# Patient Record
Sex: Male | Born: 1980 | Race: Black or African American | Hispanic: No | Marital: Single | Smoking: Never smoker
Health system: Southern US, Community
[De-identification: ages and names within clinical notes are randomized; demographics above are authoritative.]

## PROBLEM LIST (undated history)

## (undated) DIAGNOSIS — I10 Essential (primary) hypertension: Secondary | ICD-10-CM

## (undated) DIAGNOSIS — G4730 Sleep apnea, unspecified: Secondary | ICD-10-CM

## (undated) DIAGNOSIS — R7303 Prediabetes: Secondary | ICD-10-CM

## (undated) HISTORY — DX: Prediabetes: R73.03

## (undated) HISTORY — DX: Sleep apnea, unspecified: G47.30

---

## 2018-11-10 ENCOUNTER — Other Ambulatory Visit: Payer: Self-pay

## 2018-11-10 ENCOUNTER — Emergency Department (HOSPITAL_BASED_OUTPATIENT_CLINIC_OR_DEPARTMENT_OTHER)
Admit: 2018-11-10 | Discharge: 2018-11-10 | Disposition: A | Discharge status: Home / Self Care | Payer: Self-pay | Attending: Emergency Medicine | Admitting: Emergency Medicine

## 2018-11-10 ENCOUNTER — Emergency Department (HOSPITAL_COMMUNITY)
Admission: EM | Admit: 2018-11-10 | Discharge: 2018-11-10 | Disposition: A | Discharge status: Home / Self Care | Payer: Self-pay | Attending: Emergency Medicine | Admitting: Emergency Medicine

## 2018-11-10 ENCOUNTER — Encounter (HOSPITAL_COMMUNITY): Payer: Self-pay | Admitting: *Deleted

## 2018-11-10 DIAGNOSIS — M79605 Pain in left leg: Secondary | ICD-10-CM

## 2018-11-10 DIAGNOSIS — M79609 Pain in unspecified limb: Secondary | ICD-10-CM

## 2018-11-10 DIAGNOSIS — R590 Localized enlarged lymph nodes: Secondary | ICD-10-CM | POA: Insufficient documentation

## 2018-11-10 LAB — BASIC METABOLIC PANEL
Anion gap: 11 (ref 5–15)
BUN: 12 mg/dL (ref 6–20)
CO2: 23 mmol/L (ref 22–32)
Calcium: 9.4 mg/dL (ref 8.9–10.3)
Chloride: 104 mmol/L (ref 98–111)
Creatinine, Ser: 1.06 mg/dL (ref 0.61–1.24)
GFR calc Af Amer: >60 mL/min (ref >60)
GFR calc non Af Amer: >60 mL/min (ref >60)
Glucose, Bld: 95 mg/dL (ref 70–99)
Potassium: 3.8 mmol/L (ref 3.5–5.1)
Sodium: 138 mmol/L (ref 135–145)

## 2018-11-10 LAB — CBC
HCT: 42.4 % (ref 39.0–52.0)
Hemoglobin: 14 g/dL (ref 13.0–17.0)
MCH: 27 pg (ref 26.0–34.0)
MCHC: 33 g/dL (ref 30.0–36.0)
MCV: 81.9 fL (ref 80.0–100.0)
Platelets: 370 10*3/uL (ref 150–400)
RBC: 5.18 MIL/uL (ref 4.22–5.81)
RDW: 13.9 % (ref 11.5–15.5)
WBC: 6.8 10*3/uL (ref 4.0–10.5)
nRBC: 0 % (ref 0.0–0.2)

## 2018-11-10 LAB — URINALYSIS, ROUTINE W REFLEX MICROSCOPIC
Bilirubin Urine: NEGATIVE
Glucose, UA: NEGATIVE mg/dL
Hgb urine dipstick: NEGATIVE
Ketones, ur: NEGATIVE mg/dL
Leukocytes,Ua: NEGATIVE
Nitrite: NEGATIVE
Protein, ur: NEGATIVE mg/dL
Specific Gravity, Urine: 1.019 (ref 1.005–1.030)
pH: 6 (ref 5.0–8.0)

## 2018-11-10 NOTE — ED Provider Notes (Signed)
[facility] EMERGENCY DEPARTMENT Provider Note   CSN: [id number] Arrival date & time: 11/10/18  1547    History   Chief Complaint Chief Complaint  Patient presents with  . Leg Pain    HPI Dean Banks is a 38 y.o. male.    HPI Patient presents to the emergency room for evaluation of leg pain.  Patient states he started having pain in his left thigh and leg last night.  Continue taking discomfort.  He also felt like his leg started to go numb.  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  Sounds like he took the initial prescription a few weeks but then did not follow-up with anyone and did not continue his prescription.  She denies any chest pain or shortness of breath.  No fevers or chills.  History reviewed. No pertinent past medical history.  There are no active problems to display for this patient.   History reviewed. No pertinent surgical history.      Home Medications    Prior to Admission medications   Not on File    Family History No family history on file.  Social History Social History   Tobacco Use  . Smoking status: Never Smoker  . Smokeless tobacco: Never Used  Substance Use Topics  . Alcohol use: Not Currently  . Drug use: Yes    Types: Marijuana     Allergies   Patient has no known allergies.   Review of Systems Review of Systems  Constitutional: Negative for fever.  Respiratory: Negative for shortness of breath.   Cardiovascular: Negative for chest pain.  Genitourinary: Negative for discharge, dysuria and penile pain.       Patient did feel a lump in the right inguinal region  All other systems reviewed and are negative.    Physical Exam Updated Vital Signs BP (!) 157/99   Pulse 90   Temp 98.2 F (36.8 C) (Oral)   Resp 16   Ht 1.753 m (5\' 9" )   Wt 117.9 kg   SpO2 99%   BMI 38.40 kg/m   Physical Exam Vitals signs and nursing note reviewed.  Constitutional:      General: He is not in  acute distress.    Appearance: He is well-developed.  HENT:     Head: Normocephalic and atraumatic.     Right Ear: External ear normal.     Left Ear: External ear normal.  Eyes:     General: No scleral icterus.       Right eye: No discharge.        Left eye: No discharge.     Conjunctiva/sclera: Conjunctivae normal.  Neck:     Musculoskeletal: Neck supple.     Trachea: No tracheal deviation.  Cardiovascular:     Rate and Rhythm: Normal rate and regular rhythm.  Pulmonary:     Effort: Pulmonary effort is normal. No respiratory distress.     Breath sounds: Normal breath sounds. No stridor. No wheezing or rales.  Abdominal:     General: Bowel sounds are normal. There is no distension.     Palpations: Abdomen is soft.     Tenderness: There is no abdominal tenderness. There is no guarding or rebound.  Genitourinary:    Comments: Small right renal lymphadenopathy, no edema or erythema Musculoskeletal:        General: No tenderness.     Comments: No appreciable edema of his lower extremities, Process tenderness.  Skin:  General: Skin is warm and dry.     Findings: No rash.  Neurological:     Mental Status: He is alert.     Cranial Nerves: No cranial nerve deficit (no facial droop, extraocular movements intact, no slurred speech).     Sensory: No sensory deficit.     Motor: No abnormal muscle tone or seizure activity.     Coordination: Coordination normal.      ED Treatments / Results  Labs (all labs ordered are listed, but only abnormal results are displayed) Labs Reviewed  CBC  BASIC METABOLIC PANEL  URINALYSIS, ROUTINE W REFLEX MICROSCOPIC    EKG None  Radiology Vas [place] Lower Extremity Venous (dvt) (mc And Wl 7a-7p)  Result Date: 11/10/2018  Lower Venous Study Indications: Pain.  Risk Factors: DVT H/O DVT a year ago at another facility. Performing Technologist: Toma Deiters RVS  Examination Guidelines: A complete evaluation includes B-mode imaging, spectral  Doppler, color Doppler, and power Doppler as needed of all accessible portions of each vessel. Bilateral testing is considered an integral part of a complete examination. Limited examinations for reoccurring indications may be performed as noted.  +-----+---------------+---------+-----------+----------+-------+ RIGHTCompressibilityPhasicitySpontaneityPropertiesSummary +-----+---------------+---------+-----------+----------+-------+ CFV  Full           Yes      Yes                          +-----+---------------+---------+-----------+----------+-------+ SFJ  Full                                                 +-----+---------------+---------+-----------+----------+-------+ Enlargement of the inguinl lymph nodes noted  +---------+---------------+---------+-----------+----------+-------+ LEFT     CompressibilityPhasicitySpontaneityPropertiesSummary +---------+---------------+---------+-----------+----------+-------+ CFV      Full           Yes      Yes                          +---------+---------------+---------+-----------+----------+-------+ SFJ      Full                                                 +---------+---------------+---------+-----------+----------+-------+ FV Prox  Full           Yes      Yes                          +---------+---------------+---------+-----------+----------+-------+ FV Mid   Full                                                 +---------+---------------+---------+-----------+----------+-------+ FV DistalFull           Yes      Yes                          +---------+---------------+---------+-----------+----------+-------+ PFV      Full           Yes      Yes                          +---------+---------------+---------+-----------+----------+-------+  POP      Full           Yes      Yes                          +---------+---------------+---------+-----------+----------+-------+ PTV      Full                                                  +---------+---------------+---------+-----------+----------+-------+ PERO     Full                                                 +---------+---------------+---------+-----------+----------+-------+     Summary: Right: There is no evidence of a common femoral vein obstruction. Ultrasound characteristics of enlarged lymph nodes are noted in the groin. Left: There is no evidence of deep vein thrombosis in the lower extremity. No cystic structure found in the popliteal fossa.  *See table(s) above for measurements and observations.    Preliminary     Procedures Procedures (including critical care time)  Medications Ordered in ED Medications - No data to display   Initial Impression / Assessment and Plan / ED Course  I have reviewed the triage vital signs and the nursing notes.  Pertinent labs & imaging results that were available during my care of the patient were reviewed by me and considered in my medical decision making (see chart for details).      Complained of left leg pain.  He was concerned about a recurrent DVT.  The ultrasound was negative for DVT.  Patient did complain of some swelling in the groin area.  He did have a palpable lymph node.  This was confirmed on the ultrasound.  His CBC and metabolic panel are normal.  He denies any urinary symptoms.  No symptoms to suggest STD.  Urinalysis unremarkable.  Recommend outpatient follow-up if the symptoms do not resolve.  Monitor for signs of infection.  Final Clinical Impressions(s) / ED Diagnoses   Final diagnoses:  Left leg pain  Inguinal lymphadenopathy    ED Discharge Orders    None       Linwood DibblesKnapp, Demaris Bousquet, MD 11/10/18 1904

## 2018-11-10 NOTE — ED Triage Notes (Signed)
Pt in c/o L leg pain onset last night, hx of blood clot to same leg last year, states, "My leg went numb and that happened when I had a blood clot." denies taking blood thinners, denies SOB, no swelling to L leg noted

## 2018-11-10 NOTE — Discharge Instructions (Addendum)
Monitor the lymph node swelling.  If you start having fevers, redness or signs of infection of the skin have that evaluated.  Lymph node swelling should resolve over the next couple of weeks.  Follow-up with your primary care doctor if the symptoms persist.

## 2018-11-10 NOTE — Progress Notes (Signed)
Left lower extremity venous duplex completed. Preliminary results in Chart review CV Proc. [employer], RVS 11/10/2018,5:38 pm

## 2018-11-10 NOTE — ED Notes (Signed)
Vascular tech at bedside at this time

## 2018-12-15 ENCOUNTER — Other Ambulatory Visit: Payer: Self-pay

## 2018-12-15 ENCOUNTER — Emergency Department (HOSPITAL_COMMUNITY)
Admission: EM | Admit: 2018-12-15 | Discharge: 2018-12-16 | Disposition: A | Discharge status: Home / Self Care | Payer: Self-pay | Attending: Emergency Medicine | Admitting: Emergency Medicine

## 2018-12-15 DIAGNOSIS — F1210 Cannabis abuse, uncomplicated: Secondary | ICD-10-CM | POA: Insufficient documentation

## 2018-12-15 DIAGNOSIS — N509 Disorder of male genital organs, unspecified: Secondary | ICD-10-CM | POA: Insufficient documentation

## 2018-12-15 LAB — URINALYSIS, ROUTINE W REFLEX MICROSCOPIC
Bilirubin Urine: NEGATIVE
Glucose, UA: NEGATIVE mg/dL
Hgb urine dipstick: NEGATIVE
Ketones, ur: 5 mg/dL — AB
Leukocytes,Ua: NEGATIVE
Nitrite: NEGATIVE
Protein, ur: 30 mg/dL — AB
Specific Gravity, Urine: 1.032 — ABNORMAL HIGH (ref 1.005–1.030)
pH: 5 (ref 5.0–8.0)

## 2018-12-15 LAB — CBC
HCT: 41 % (ref 39.0–52.0)
Hemoglobin: 13.4 g/dL (ref 13.0–17.0)
MCH: 26.7 pg (ref 26.0–34.0)
MCHC: 32.7 g/dL (ref 30.0–36.0)
MCV: 81.8 fL (ref 80.0–100.0)
Platelets: 337 10*3/uL (ref 150–400)
RBC: 5.01 MIL/uL (ref 4.22–5.81)
RDW: 13.8 % (ref 11.5–15.5)
WBC: 6.2 10*3/uL (ref 4.0–10.5)
nRBC: 0 % (ref 0.0–0.2)

## 2018-12-15 LAB — BASIC METABOLIC PANEL
Anion gap: 8 (ref 5–15)
BUN: 12 mg/dL (ref 6–20)
CO2: 23 mmol/L (ref 22–32)
Calcium: 9.2 mg/dL (ref 8.9–10.3)
Chloride: 106 mmol/L (ref 98–111)
Creatinine, Ser: 1.27 mg/dL — ABNORMAL HIGH (ref 0.61–1.24)
GFR calc Af Amer: >60 mL/min (ref >60)
GFR calc non Af Amer: >60 mL/min (ref >60)
Glucose, Bld: 110 mg/dL — ABNORMAL HIGH (ref 70–99)
Potassium: 3.5 mmol/L (ref 3.5–5.1)
Sodium: 137 mmol/L (ref 135–145)

## 2018-12-15 NOTE — ED Triage Notes (Signed)
Pt endorses hematuria since yesterday. Right sided pelvic pain. Tachy. Denies chills or bodyaches.

## 2018-12-16 LAB — GC/CHLAMYDIA PROBE AMP (~~LOC~~) NOT AT ARMC
Chlamydia: NEGATIVE
Neisseria Gonorrhea: NEGATIVE

## 2018-12-16 LAB — HIV ANTIBODY (ROUTINE TESTING W REFLEX): HIV Screen 4th Generation wRfx: NONREACTIVE

## 2018-12-16 MED ORDER — CEFTRIAXONE SODIUM 250 MG IJ SOLR
250.0000 mg | Freq: Once | INTRAMUSCULAR | Status: AC
  Administered 2018-12-16: 250 mg via INTRAMUSCULAR
  Filled 2018-12-16: qty 250

## 2018-12-16 MED ORDER — AZITHROMYCIN 250 MG PO TABS
1000.0000 mg | ORAL_TABLET | Freq: Once | ORAL | Status: AC
  Administered 2018-12-16: 1000 mg via ORAL
  Filled 2018-12-16: qty 4

## 2018-12-16 MED ORDER — VALACYCLOVIR HCL 1 G PO TABS: 1000.0000 mg | ORAL_TABLET | Freq: Two times a day (BID) | ORAL | 0 refills | Status: AC

## 2018-12-16 MED ORDER — STERILE WATER FOR INJECTION IJ SOLN
INTRAMUSCULAR | Status: AC
  Filled 2018-12-16: qty 10

## 2018-12-16 MED ORDER — VALACYCLOVIR HCL 500 MG PO TABS
1000.0000 mg | ORAL_TABLET | Freq: Once | ORAL | Status: AC
  Administered 2018-12-16: 1000 mg via ORAL
  Filled 2018-12-16: qty 2

## 2018-12-16 NOTE — ED Provider Notes (Signed)
[facility]  [facility] EMERGENCY DEPARTMENT Provider Note   CSN: [id number] Arrival date & time: 12/15/18  1802    History   Chief Complaint Chief Complaint  Patient presents with  . Hematuria    HPI Dean Banks is a 38 y.o. male.     The history is provided by the patient and medical records.     38 year old male here with hematuria.  States he noticed this once yesterday.  States it was his first urination after getting out of bed, he did pass a small blood clot in his urine, took a photo of this and I have reviewed.  He denies any ongoing hematuria.  No dysuria or urinary frequency.  He denies any penile discharge.  He is in a monogamous relationship with one male sexual partner, they have not had any issues.  He does report he has had some "ulcers" on his genitals for about a week now along with an enlarged lymph node in right groin.  States initially they were extremely painful but seems to be getting better, lymph node has decreased in size as well.  He has no known history of HSV, unknown exposure.  He has not had any formal STD testing.  He has not had any fever or chills.  No flank pain.  No history of kidney stones.  No abdominal pain, nausea, vomiting, or diarrhea.  No past medical history on file.  There are no active problems to display for this patient.   No past surgical history on file.      Home Medications    Prior to Admission medications   Not on File    Family History No family history on file.  Social History Social History   Tobacco Use  . Smoking status: Never Smoker  . Smokeless tobacco: Never Used  Substance Use Topics  . Alcohol use: Not Currently  . Drug use: Yes    Types: Marijuana     Allergies   Patient has no known allergies.   Review of Systems Review of Systems  Genitourinary: Positive for genital sores and hematuria.  All other systems reviewed and are negative.    Physical Exam Updated Vital  Signs BP (!) 144/104 (BP Location: Left Arm)   Pulse 96   Temp 98.4 F (36.9 C) (Oral)   Resp 16   SpO2 100%   Physical Exam Vitals signs and nursing note reviewed.  Constitutional:      Appearance: He is well-developed.  HENT:     Head: Normocephalic and atraumatic.  Eyes:     Conjunctiva/sclera: Conjunctivae normal.     Pupils: Pupils are equal, round, and reactive to light.  Neck:     Musculoskeletal: Normal range of motion.  Cardiovascular:     Rate and Rhythm: Normal rate and regular rhythm.     Heart sounds: Normal heart sounds.  Pulmonary:     Effort: Pulmonary effort is normal.     Breath sounds: Normal breath sounds.  Abdominal:     General: Bowel sounds are normal.     Palpations: Abdomen is soft.  Genitourinary:    Penis: Circumcised.      Comments: multiple ulcerated lesions of right scrotum, most of these are crusted over but the largest still appears open and is TTP, no drainage or bleeding observed, testicles are non-tender and do not appear swollen, penis circumcised without visible blood or discharge, no penile lesions Musculoskeletal: Normal range of motion.  Lymphadenopathy:  Comments: Mildly enlarged lymph node of right groin, no significant tenderness  Skin:    General: Skin is warm and dry.  Neurological:     Mental Status: He is alert and oriented to person, place, and time.      ED Treatments / Results  Labs (all labs ordered are listed, but only abnormal results are displayed) Labs Reviewed  BASIC METABOLIC PANEL - Abnormal; Notable for the following components:      Result Value   Glucose, Bld 110 (*)    Creatinine, Ser 1.27 (*)    All other components within normal limits  URINALYSIS, ROUTINE W REFLEX MICROSCOPIC - Abnormal; Notable for the following components:   APPearance HAZY (*)    Specific Gravity, Urine 1.032 (*)    Ketones, ur 5 (*)    Protein, ur 30 (*)    Bacteria, UA FEW (*)    All other components within normal limits   CBC  HIV ANTIBODY (ROUTINE TESTING W REFLEX)  RPR  GC/CHLAMYDIA PROBE AMP ([facility]    EKG None  Radiology No results found.  Procedures Procedures (including critical care time)  Medications Ordered in ED Medications - No data to display   Initial Impression / Assessment and Plan / ED Course  I have reviewed the triage vital signs and the nursing notes.  Pertinent labs & imaging results that were available during my care of the patient were reviewed by me and considered in my medical decision making (see chart for details).  38 year old male here with hematuria.  Reports he noticed this this morning after waking, first urination of the day only.  He has not had any further episodes.  He denies any abdominal or flank pain.  He does report some "genital ulcers" and enlarged lymph node in his right groin for the past week, but both seem to be "getting better".  He denies any known history of STD but has not had any formal testing.  His sexual partner is asymptomatic.  He is afebrile and nontoxic in appearance.  He does have multiple ulcerated lesions of the right scrotum, majority of these appear crusted over but one does appear newer and is tender to palpation.  No signs of superimposed infection.  No testicular swelling or tenderness.  Mildly enlarged lymph node of right groin is likely reactive.  Genital lesions are concerning for herpes which I discussed with patient.  As he has not had any formal STD testing will complete that here including gc/chl, HIV, RPR.  His labs are overall reassuring, UA without any noted blood, few bacteria.  No reported dysuria.  Will treat empirically with Rocephin and azithromycin as well as course of Valtrex.  He was encouraged to notify his partner and monitor symptoms.  Follow-up with PCP.  He will be notified of culture results in the next 48 to 72 hours.  Can return here for any new/acute changes.  Final Clinical Impressions(s) / ED  Diagnoses   Final diagnoses:  Genital disease, male    ED Discharge Orders         Ordered    valACYclovir (VALTREX) 1000 MG tablet  2 times daily     12/16/18 0237           Garlon HatchetSanders,  M, PA-C 12/16/18 [id number]    Dione BoozeGlick, David, MD 12/16/18 [phone]

## 2018-12-16 NOTE — Discharge Instructions (Signed)
Take the prescribed medication as directed. Make sure you notify your sexual partner of the things we talked about today. Your STD culture results should come back in the next 48 to 72 hours, you will be contacted with any abnormal results. Follow-up with your primary care doctor. Return to the ED for new or worsening symptoms.

## 2018-12-16 NOTE — ED Notes (Signed)
Patient verbalizes understanding of discharge instructions. Opportunity for questioning and answers were provided. Armband removed by staff, pt discharged from ED.  

## 2018-12-17 LAB — RPR: RPR Ser Ql: REACTIVE — AB

## 2018-12-17 LAB — RPR, QUANT+TP ABS (REFLEX)
Rapid Plasma Reagin, Quant: 1:32 {titer} — ABNORMAL HIGH
T Pallidum Abs: REACTIVE — AB

## 2019-05-15 ENCOUNTER — Ambulatory Visit
Admission: EM | Admit: 2019-05-15 | Discharge: 2019-05-15 | Disposition: A | Discharge status: Home / Self Care | Payer: Self-pay | Attending: Physician Assistant | Admitting: Physician Assistant

## 2019-05-15 ENCOUNTER — Encounter: Payer: Self-pay | Admitting: Emergency Medicine

## 2019-05-15 ENCOUNTER — Other Ambulatory Visit: Payer: Self-pay

## 2019-05-15 DIAGNOSIS — R300 Dysuria: Secondary | ICD-10-CM

## 2019-05-15 DIAGNOSIS — M545 Low back pain, unspecified: Secondary | ICD-10-CM

## 2019-05-15 HISTORY — DX: Essential (primary) hypertension: I10

## 2019-05-15 LAB — POCT URINALYSIS DIP (MANUAL ENTRY)
Glucose, UA: NEGATIVE mg/dL
Nitrite, UA: NEGATIVE
Protein Ur, POC: 30 mg/dL — AB
Spec Grav, UA: >=1.03 — AB (ref 1.010–1.025)
Urobilinogen, UA: 4 E.U./dL — AB
pH, UA: 5.5 (ref 5.0–8.0)

## 2019-05-15 MED ORDER — TIZANIDINE HCL 4 MG PO TABS: 4.0000 mg | ORAL_TABLET | Freq: Three times a day (TID) | ORAL | 0 refills | Status: DC | PRN

## 2019-05-15 NOTE — ED Notes (Signed)
Patient able to ambulate independently  

## 2019-05-15 NOTE — Discharge Instructions (Signed)
As discussed, your urine does not show much bacteria, but given abnormal values, I'd like to evaluate your kidney/liver function, as well as muscle breakdown. I will wait on prescription pain medicines until I know your kidney and liver function. Start tizanidine for muscle spasms/back pain for now. Keep hydrated. If any worsening symptoms with abdominal pain, nausea/vomiting, confusion, go to the emergency department for further evaluation. Otherwise, I will call you tomorrow once I have results for further directions.

## 2019-05-15 NOTE — ED Provider Notes (Signed)
[facility]    CSN: [id number] Arrival date & time: 05/15/19  1220      History   Chief Complaint Chief Complaint  Patient presents with  . Back Pain    HPI Dean Banks is a 38 y.o. male.   38 year old male comes in for 3 week history of right lower back pain. States for the past 2 days, noticed darker urine and therefore came in for evaluation.  Denies injury/trauma.  States back pain is right-sided, intermittent, worse with long hours of standing and sitting.  He describes the pain as aching most of the time, but can have intermittent muscle spasms.  Pain can radiate down the right leg.  He notices some aching with urination, but denies urinary frequency, hematuria.  Denies abdominal pain, nausea, vomiting.  Denies fever, chills, body aches.  Denies saddle anesthesia, loss of bladder or bowel control.  Denies penile discharge, penile lesion, testicular swelling, testicular pain.  Denies strenuous activity, changes in diet/food, changes in medication.  Sexually active with one male partner, no condom use.  No worries for STDs.  Has been using topical icy hot with some relief of back pain.     Past Medical History:  Diagnosis Date  . Hypertension     There are no active problems to display for this patient.   History reviewed. No pertinent surgical history.     Home Medications    Prior to Admission medications   Medication Sig Start Date End Date Taking? Authorizing Provider  tiZANidine (ZANAFLEX) 4 MG tablet Take 1 tablet (4 mg total) by mouth every 8 (eight) hours as needed for muscle spasms. 05/15/19   Ok Edwards, PA-C    Family History Family History  Problem Relation Age of Onset  . Hypertension Mother   . Diabetes Mother     Social History Social History   Tobacco Use  . Smoking status: Never Smoker  . Smokeless tobacco: Never Used  Substance Use Topics  . Alcohol use: Not Currently  . Drug use: Yes    Types: Marijuana      Allergies   Patient has no known allergies.   Review of Systems Review of Systems  Reason unable to perform ROS: See HPI as above.     Physical Exam Triage Vital Signs ED Triage Vitals  Enc Vitals Group     BP 05/15/19 1229 (!) 136/99     Pulse Rate 05/15/19 1229 (!) 101     Resp 05/15/19 1229 18     Temp 05/15/19 1229 98.1 F (36.7 C)     Temp Source 05/15/19 1229 Oral     SpO2 05/15/19 1229 96 %     Weight --      Height --      Head Circumference --      Peak Flow --      Pain Score 05/15/19 1228 8     Pain Loc --      Pain Edu? --      Excl. in [place]? --    No data found.  Updated Vital Signs BP (!) 136/99 (BP Location: Left Arm)   Pulse (!) 101   Temp 98.1 F (36.7 C) (Oral)   Resp 18   SpO2 96%   Physical Exam Constitutional:      General: He is not in acute distress.    Appearance: He is well-developed. He is not diaphoretic.  HENT:     Head: Normocephalic and atraumatic.  Eyes:     Conjunctiva/sclera: Conjunctivae normal.     Pupils: Pupils are equal, round, and reactive to light.  Cardiovascular:     Rate and Rhythm: Normal rate and regular rhythm.     Heart sounds: Normal heart sounds. No murmur. No friction rub. No gallop.   Pulmonary:     Effort: Pulmonary effort is normal. No accessory muscle usage or respiratory distress.     Breath sounds: Normal breath sounds. No stridor. No decreased breath sounds, wheezing, rhonchi or rales.  Musculoskeletal:     Comments: No tenderness on palpation of the spinous processes.  Tenderness to palpation of right medial lumbar region along L4/L5.  No tenderness to palpation of the hips.  Full range of motion back and hips. Strength normal and equal bilaterally. Sensation intact and equal bilaterally.  Negative straight leg raise.  Skin:    General: Skin is warm and dry.  Neurological:     Mental Status: He is alert and oriented to person, place, and time.      UC Treatments / Results  Labs (all labs  ordered are listed, but only abnormal results are displayed) Labs Reviewed  POCT URINALYSIS DIP (MANUAL ENTRY) - Abnormal; Notable for the following components:      Result Value   Color, UA orange (*)    Clarity, UA cloudy (*)    Bilirubin, UA small (*)    Ketones, POC UA trace (5) (*)    Spec Grav, UA >=1.030 (*)    Blood, UA trace-intact (*)    Protein Ur, POC =30 (*)    Urobilinogen, UA 4.0 (*)    Leukocytes, UA Trace (*)    All other components within normal limits  URINE CULTURE  COMPREHENSIVE METABOLIC PANEL  CK    EKG   Radiology No results found.  Procedures Procedures (including critical care time)  Medications Ordered in UC Medications - No data to display  Initial Impression / Assessment and Plan / UC Course  I have reviewed the triage vital signs and the nursing notes.  Pertinent labs & imaging results that were available during my care of the patient were reviewed by me and considered in my medical decision making (see chart for details).    Discussed urine dipstick result with patient.  Patient denies taking AZO, other medications for urinary symptoms.  Given dipstick result, will assess for kidney and liver function with CMP.  Patient without diffuse body aches/recent strenuous activity, however, given dipstick result, will also assess for rhabdo myelitis with CK.  Will have patient push fluid at this time.  Will provide tizanidine for back pain at this time.  Will reassess after lab work, and will update plan at that time.  Return precautions given.  Patient expresses understanding and agrees to plan.    Patient discharged in stable condition pending lab work.  Final Clinical Impressions(s) / UC Diagnoses   Final diagnoses:  Dysuria  Acute right-sided low back pain without sciatica   ED Prescriptions    Medication Sig Dispense Auth. Provider   tiZANidine (ZANAFLEX) 4 MG tablet Take 1 tablet (4 mg total) by mouth every 8 (eight) hours as needed for  muscle spasms. 12 tablet Belinda Fisher, PA-C     PDMP not reviewed this encounter.   Belinda Fisher, PA-C 05/15/19 1302

## 2019-05-15 NOTE — ED Triage Notes (Signed)
Pt presents to [facility] for assessment of right lower back pain x 3 weeks.  Patient states standing or sitting for long periods of time makes it worse.  C/o some radiation down leg "sometimes".  Also states his urine has been more dark the last few days.  Denies blood, or difficulty urinating.  Denies known injury.  States it "catches" him sometimes when he goes from being bent over to standing.

## 2019-05-16 ENCOUNTER — Encounter (HOSPITAL_COMMUNITY): Payer: Self-pay | Admitting: Emergency Medicine

## 2019-05-16 ENCOUNTER — Telehealth: Payer: Self-pay | Admitting: Emergency Medicine

## 2019-05-16 ENCOUNTER — Emergency Department (HOSPITAL_COMMUNITY): Payer: Self-pay

## 2019-05-16 ENCOUNTER — Emergency Department (HOSPITAL_COMMUNITY)
Admission: EM | Admit: 2019-05-16 | Discharge: 2019-05-16 | Disposition: A | Discharge status: Home / Self Care | Payer: Self-pay | Attending: Emergency Medicine | Admitting: Emergency Medicine

## 2019-05-16 ENCOUNTER — Other Ambulatory Visit: Payer: Self-pay

## 2019-05-16 DIAGNOSIS — I10 Essential (primary) hypertension: Secondary | ICD-10-CM | POA: Insufficient documentation

## 2019-05-16 DIAGNOSIS — N50811 Right testicular pain: Secondary | ICD-10-CM | POA: Insufficient documentation

## 2019-05-16 DIAGNOSIS — R319 Hematuria, unspecified: Secondary | ICD-10-CM | POA: Insufficient documentation

## 2019-05-16 DIAGNOSIS — R1033 Periumbilical pain: Secondary | ICD-10-CM | POA: Insufficient documentation

## 2019-05-16 DIAGNOSIS — N410 Acute prostatitis: Secondary | ICD-10-CM | POA: Insufficient documentation

## 2019-05-16 DIAGNOSIS — N50812 Left testicular pain: Secondary | ICD-10-CM | POA: Insufficient documentation

## 2019-05-16 LAB — URINALYSIS, ROUTINE W REFLEX MICROSCOPIC
Bilirubin Urine: NEGATIVE
Glucose, UA: NEGATIVE mg/dL
Ketones, ur: NEGATIVE mg/dL
Nitrite: NEGATIVE
Protein, ur: 100 mg/dL — AB
RBC / HPF: >50 RBC/hpf — ABNORMAL HIGH (ref 0–5)
Specific Gravity, Urine: 1.027 (ref 1.005–1.030)
WBC, UA: >50 WBC/hpf — ABNORMAL HIGH (ref 0–5)
pH: 5 (ref 5.0–8.0)

## 2019-05-16 LAB — CBC WITH DIFFERENTIAL/PLATELET
Abs Immature Granulocytes: 0.06 10*3/uL (ref 0.00–0.07)
Basophils Absolute: 0.1 10*3/uL (ref 0.0–0.1)
Basophils Relative: 1 %
Eosinophils Absolute: 0.1 10*3/uL (ref 0.0–0.5)
Eosinophils Relative: 1 %
HCT: 43.1 % (ref 39.0–52.0)
Hemoglobin: 14.4 g/dL (ref 13.0–17.0)
Immature Granulocytes: 0 %
Lymphocytes Relative: 10 %
Lymphs Abs: 1.5 10*3/uL (ref 0.7–4.0)
MCH: 27.4 pg (ref 26.0–34.0)
MCHC: 33.4 g/dL (ref 30.0–36.0)
MCV: 82.1 fL (ref 80.0–100.0)
Monocytes Absolute: 1.5 10*3/uL — ABNORMAL HIGH (ref 0.1–1.0)
Monocytes Relative: 10 %
Neutro Abs: 12.2 10*3/uL — ABNORMAL HIGH (ref 1.7–7.7)
Neutrophils Relative %: 78 %
Platelets: 335 10*3/uL (ref 150–400)
RBC: 5.25 MIL/uL (ref 4.22–5.81)
RDW: 14.2 % (ref 11.5–15.5)
WBC: 15.3 10*3/uL — ABNORMAL HIGH (ref 4.0–10.5)
nRBC: 0 % (ref 0.0–0.2)

## 2019-05-16 LAB — CK
Total CK: 456 U/L — ABNORMAL HIGH (ref 49–439)
Total CK: 432 U/L — ABNORMAL HIGH (ref 49–397)

## 2019-05-16 LAB — COMPREHENSIVE METABOLIC PANEL
ALT: 46 IU/L — ABNORMAL HIGH (ref 0–44)
ALT: 52 U/L — ABNORMAL HIGH (ref 0–44)
AST: 39 IU/L (ref 0–40)
AST: 42 U/L — ABNORMAL HIGH (ref 15–41)
Albumin/Globulin Ratio: 1.6 (ref 1.2–2.2)
Albumin: 4.6 g/dL (ref 4.0–5.0)
Albumin: 3.9 g/dL (ref 3.5–5.0)
Alkaline Phosphatase: 120 IU/L — ABNORMAL HIGH (ref 39–117)
Alkaline Phosphatase: 104 U/L (ref 38–126)
Anion gap: 10 (ref 5–15)
BUN/Creatinine Ratio: 15 (ref 9–20)
BUN: 14 mg/dL (ref 6–20)
BUN: 13 mg/dL (ref 6–20)
Bilirubin Total: 0.6 mg/dL (ref 0.0–1.2)
CO2: 22 mmol/L (ref 20–29)
CO2: 23 mmol/L (ref 22–32)
Calcium: 9.6 mg/dL (ref 8.7–10.2)
Calcium: 9.4 mg/dL (ref 8.9–10.3)
Chloride: 101 mmol/L (ref 96–106)
Chloride: 105 mmol/L (ref 98–111)
Creatinine, Ser: 0.96 mg/dL (ref 0.76–1.27)
Creatinine, Ser: 1.01 mg/dL (ref 0.61–1.24)
GFR calc Af Amer: 116 mL/min/{1.73_m2} (ref >59)
GFR calc Af Amer: >60 mL/min (ref >60)
GFR calc non Af Amer: 101 mL/min/{1.73_m2} (ref >59)
GFR calc non Af Amer: >60 mL/min (ref >60)
Globulin, Total: 2.8 g/dL (ref 1.5–4.5)
Glucose, Bld: 115 mg/dL — ABNORMAL HIGH (ref 70–99)
Glucose: 104 mg/dL — ABNORMAL HIGH (ref 65–99)
Potassium: 4.1 mmol/L (ref 3.5–5.2)
Potassium: 3.6 mmol/L (ref 3.5–5.1)
Sodium: 137 mmol/L (ref 134–144)
Sodium: 138 mmol/L (ref 135–145)
Total Bilirubin: 1.1 mg/dL (ref 0.3–1.2)
Total Protein: 7.4 g/dL (ref 6.0–8.5)
Total Protein: 7.8 g/dL (ref 6.5–8.1)

## 2019-05-16 MED ORDER — MORPHINE SULFATE (PF) 4 MG/ML IV SOLN
4.0000 mg | Freq: Once | INTRAVENOUS | Status: AC
  Administered 2019-05-16: 4 mg via INTRAVENOUS
  Filled 2019-05-16: qty 1

## 2019-05-16 MED ORDER — DOXYCYCLINE HYCLATE 100 MG PO CAPS: 100.0000 mg | ORAL_CAPSULE | Freq: Two times a day (BID) | ORAL | 0 refills | Status: AC

## 2019-05-16 MED ORDER — HYDROCODONE-ACETAMINOPHEN 5-325 MG PO TABS: 2.0000 | ORAL_TABLET | ORAL | 0 refills | Status: DC | PRN

## 2019-05-16 MED ORDER — ONDANSETRON HCL 4 MG/2ML IJ SOLN
4.0000 mg | Freq: Once | INTRAMUSCULAR | Status: AC
  Administered 2019-05-16: 12:00:00 4 mg via INTRAVENOUS
  Filled 2019-05-16: qty 2

## 2019-05-16 MED ORDER — STERILE WATER FOR INJECTION IJ SOLN
INTRAMUSCULAR | Status: AC
  Administered 2019-05-16: 10 mL
  Filled 2019-05-16: qty 10

## 2019-05-16 MED ORDER — DOXYCYCLINE HYCLATE 100 MG PO TABS
100.0000 mg | ORAL_TABLET | Freq: Once | ORAL | Status: AC
  Administered 2019-05-16: 13:00:00 100 mg via ORAL
  Filled 2019-05-16: qty 1

## 2019-05-16 MED ORDER — SODIUM CHLORIDE 0.9 % IV BOLUS
1000.0000 mL | Freq: Once | INTRAVENOUS | Status: AC
  Administered 2019-05-16: 1000 mL via INTRAVENOUS

## 2019-05-16 MED ORDER — STERILE WATER FOR INJECTION IJ SOLN
INTRAMUSCULAR | Status: AC
  Filled 2019-05-16: qty 10

## 2019-05-16 MED ORDER — CEFTRIAXONE SODIUM 250 MG IJ SOLR
250.0000 mg | Freq: Once | INTRAMUSCULAR | Status: AC
  Administered 2019-05-16: 250 mg via INTRAMUSCULAR
  Filled 2019-05-16: qty 250

## 2019-05-16 NOTE — ED Notes (Signed)
Patient verbalizes understanding of discharge instructions. Opportunity for questioning and answers were provided. Armband removed by staff, pt discharged from ED.  

## 2019-05-16 NOTE — ED Triage Notes (Signed)
C/o bilateral testicle pain since yesterday.  States urine was dark orange colored yesterday and had blood in urine this morning.

## 2019-05-16 NOTE — ED Provider Notes (Signed)
[facility] EMERGENCY DEPARTMENT Provider Note   CSN: [id number] Arrival date & time: 05/16/19  1009     History   Chief Complaint Chief Complaint  Patient presents with  . Testicle Pain  . Hematuria    HPI Dean Banks is a 38 y.o. male.     38 year old male presents with complaint of low back pain x3 days now with blood in his urine and pain in the testicles.  Patient was seen at urgent care yesterday for low back pain, given tizanidine, had lab work-up done, no relief with the tizanidine.  She states that he feels like he has been kicked in his testicles however he has not, no trauma, no history of kidney stones.  Patient reports pain in the suprapubic area.  Patient denies fevers, chills, changes in bowel habits, nausea, vomiting.  No other complaints or concerns.     Past Medical History:  Diagnosis Date  . Hypertension     There are no active problems to display for this patient.   History reviewed. No pertinent surgical history.      Home Medications    Prior to Admission medications   Medication Sig Start Date End Date Taking? Authorizing Provider  tiZANidine (ZANAFLEX) 4 MG tablet Take 1 tablet (4 mg total) by mouth every 8 (eight) hours as needed for muscle spasms. 05/15/19  Yes Yu, Amy V, PA-C  doxycycline (VIBRAMYCIN) 100 MG capsule Take 1 capsule (100 mg total) by mouth 2 (two) times daily for 14 days. 05/16/19 05/30/19  Jeannie Fend, PA-C  HYDROcodone-acetaminophen (NORCO/VICODIN) 5-325 MG tablet Take 2 tablets by mouth every 4 (four) hours as needed. 05/16/19   Jeannie Fend, PA-C    Family History Family History  Problem Relation Age of Onset  . Hypertension Mother   . Diabetes Mother     Social History Social History   Tobacco Use  . Smoking status: Never Smoker  . Smokeless tobacco: Never Used  Substance Use Topics  . Alcohol use: Not Currently  . Drug use: Yes    Types: Marijuana     Allergies   Patient  has no known allergies.   Review of Systems Review of Systems  Constitutional: Negative for chills, diaphoresis and fever.  Respiratory: Negative for shortness of breath.   Cardiovascular: Negative for chest pain.  Gastrointestinal: Positive for abdominal pain. Negative for constipation, diarrhea, nausea and vomiting.  Genitourinary: Positive for hematuria and testicular pain. Negative for discharge, dysuria, penile pain, penile swelling and scrotal swelling.  Musculoskeletal: Positive for back pain. Negative for gait problem.  Skin: Negative for color change, rash and wound.  Allergic/Immunologic: Negative for immunocompromised state.  Hematological: Does not bruise/bleed easily.  Psychiatric/Behavioral: Negative for confusion.  All other systems reviewed and are negative.    Physical Exam Updated Vital Signs BP (!) 158/88 (BP Location: Right Arm)   Pulse 96   Temp (!) 97.4 F (36.3 C) (Oral)   Resp (!) 25   SpO2 97%   Physical Exam Vitals signs and nursing note reviewed. Exam conducted with a chaperone present.  Constitutional:      General: He is not in acute distress.    Appearance: He is well-developed. He is not diaphoretic.  HENT:     Head: Normocephalic and atraumatic.  Cardiovascular:     Rate and Rhythm: Normal rate and regular rhythm.     Pulses: Normal pulses.     Heart sounds: Normal heart sounds.  Pulmonary:  Effort: Pulmonary effort is normal.     Breath sounds: Normal breath sounds.  Chest:     Chest wall: No tenderness.  Abdominal:     Palpations: Abdomen is soft.     Tenderness: There is abdominal tenderness in the suprapubic area. There is no right CVA tenderness or left CVA tenderness.  Genitourinary:    Scrotum/Testes:        Right: Tenderness present. Mass or swelling not present.        Left: Tenderness present. Mass or swelling not present.  Musculoskeletal:        General: Tenderness present.       Back:  Skin:    General: Skin is  warm and dry.     Findings: No erythema or rash.  Neurological:     Mental Status: He is alert and oriented to person, place, and time.  Psychiatric:        Behavior: Behavior normal.      ED Treatments / Results  Labs (all labs ordered are listed, but only abnormal results are displayed) Labs Reviewed  URINALYSIS, ROUTINE W REFLEX MICROSCOPIC - Abnormal; Notable for the following components:      Result Value   Color, Urine AMBER (*)    APPearance CLOUDY (*)    Hgb urine dipstick LARGE (*)    Protein, ur 100 (*)    Leukocytes,Ua LARGE (*)    RBC / HPF >50 (*)    WBC, UA >50 (*)    Bacteria, UA MANY (*)    All other components within normal limits  CBC WITH DIFFERENTIAL/PLATELET - Abnormal; Notable for the following components:   WBC 15.3 (*)    Neutro Abs 12.2 (*)    Monocytes Absolute 1.5 (*)    All other components within normal limits  COMPREHENSIVE METABOLIC PANEL - Abnormal; Notable for the following components:   Glucose, Bld 115 (*)    AST 42 (*)    ALT 52 (*)    All other components within normal limits  CK - Abnormal; Notable for the following components:   Total CK 432 (*)    All other components within normal limits  URINE CULTURE  GC/CHLAMYDIA PROBE AMP ([place]) NOT AT [facility]    EKG EKG Interpretation  Date/Time:  Sunday May 16 2019 14:19:06 EST Ventricular Rate:  99 PR Interval:    QRS Duration: 92 QT Interval:  333 QTC Calculation: 428 R Axis:   76 Text Interpretation: Sinus rhythm Consider right atrial enlargement Borderline repolarization abnormality Artifact Abnormal ECG Confirmed by Carmin Muskrat [phone]) on 05/16/2019 2:52:47 PM   Radiology Ct Renal Stone Study  Result Date: 05/16/2019 CLINICAL DATA:  RIGHT flank pain radiating to RIGHT groin, hematuria. EXAM: CT ABDOMEN AND PELVIS WITHOUT CONTRAST TECHNIQUE: Multidetector CT imaging of the abdomen and pelvis was performed following the standard protocol without IV contrast.  COMPARISON:  None. FINDINGS: Lower chest: No acute abnormality. Hepatobiliary: No focal liver abnormality is seen. No gallstones, gallbladder wall thickening, or biliary dilatation. Pancreas: Unremarkable. No pancreatic ductal dilatation or surrounding inflammatory changes. Spleen: Normal in size without focal abnormality. Adrenals/Urinary Tract: Adrenal glands appear normal. Kidneys are unremarkable without mass, stone or hydronephrosis. No perinephric fluid. No ureteral or bladder calculi identified. Bladder is decompressed limiting characterization. Stomach/Bowel: No dilated large or small bowel loops. No evidence of bowel wall inflammation seen. Appendix is normal. Stomach is unremarkable, partially decompressed. Scattered mild diverticulosis of thew descending and transverse colon but no focal inflammatory change  to suggest acute diverticulitis. Vascular/Lymphatic: No significant vascular findings are present. No enlarged abdominal or pelvic lymph nodes. Reproductive: Prostate is unremarkable. Other: Seminal vesicles appear prominent with surrounding edema. Musculoskeletal: No acute or suspicious osseous finding. IMPRESSION: 1. Seminal vesicles appear prominent with surrounding edema, raising the possibility of prostatitis. Bladder is decompressed limiting characterization, but these adjacent inflammation could indicate concomitant cystitis. 2. Remainder of the abdomen and pelvis CT is unremarkable, as detailed above. No renal or ureteral calculi identified. No bowel obstruction or evidence of bowel wall inflammation. Appendix is normal. 3. Mild colonic diverticulosis without evidence of acute diverticulitis. Electronically Signed   By: Bary RichardStan  Maynard M.D.   On: 05/16/2019 12:20    Procedures Procedures (including critical care time)  Medications Ordered in ED Medications  sodium chloride 0.9 % bolus 1,000 mL (0 mLs Intravenous Stopped 05/16/19 1355)  ondansetron (ZOFRAN) injection 4 mg (4 mg Intravenous  Given 05/16/19 1226)  morphine 4 MG/ML injection 4 mg (4 mg Intravenous Given 05/16/19 1226)  cefTRIAXone (ROCEPHIN) injection 250 mg (250 mg Intramuscular Given 05/16/19 1308)  doxycycline (VIBRA-TABS) tablet 100 mg (100 mg Oral Given 05/16/19 1308)  sterile water (preservative free) injection (10 mLs  Given 05/16/19 1309)     Initial Impression / Assessment and Plan / ED Course  I have reviewed the triage vital signs and the nursing notes.  Pertinent labs & imaging results that were available during my care of the patient were reviewed by me and considered in my medical decision making (see chart for details).  Clinical Course as of May 15 1505  Sun May 16, 2019  [id number] 38 year old male with hematuria and suprapubic pain and pain in the testicles.  On exam with chaperone present, patient has mild tenderness bilateral testicles, no skin changes, no swelling.  Urinalysis with large hemoglobin, protein, leukocytes for blood cells white blood cells with many bacteria CBC with white count of 15.3 with elevated neutrophils, CMP with mildly elevated AST and ALT, normal renal function.  CK 432, ordered due to elevated CK at urgent care yesterday, has improved compared to yesterday's CK.  CT with findings suggesting prostatitis.  Urine culture sent as well as GC chlamydia.  Patient given Rocephin and doxycycline while in the ER, will discharge with 14-day course of doxycycline. Plan to follow-up with urology, return to ER for new or worsening symptoms.  Advised by patient's nurse, after giving injection of Rocephin patient experience sharp pain across anterior chest with sudden onset.  On recheck patient reports constant sharp pain, no pain previously.  EKG completed, no significant findings, case discussed with Dr. Jeraldine LootsLockwood, ER attending.    [LM]    Clinical Course User Index [LM] Jeannie FendMurphy,  A, PA-C      Final Clinical Impressions(s) / ED Diagnoses   Final diagnoses:  Acute prostatitis    ED  Discharge Orders         Ordered    doxycycline (VIBRAMYCIN) 100 MG capsule  2 times daily     05/16/19 1315    HYDROcodone-acetaminophen (NORCO/VICODIN) 5-325 MG tablet  Every 4 hours PRN     05/16/19 1315           Jeannie FendMurphy,  A, PA-C 05/16/19 1506    Gerhard MunchLockwood, Robert, MD 05/16/19 1551

## 2019-05-16 NOTE — Telephone Encounter (Signed)
Attempted to call patient to review labs from yesterday, did not answer, voicemail left encouraging return call.    Of note: Liver enzymes and CK slightly elevated, but not to a level of concern at this time Encourage fluids to try to lighten urine to almost clear If it does not improve with fluids, see a urologist for further evaluation If you develop body aches, inability to urinate, or worsening symptoms with urination, present to the ED for further evaluation.  Also need to follow up on how the muscle relaxer is handling his pain.  If not, able to add Mobic 7.5mg  tablet once daily x 10 days, per Amy APP

## 2019-05-16 NOTE — ED Notes (Signed)
Patient transported to CT 

## 2019-05-16 NOTE — ED Notes (Signed)
ED Provider at bedside. 

## 2019-05-16 NOTE — Discharge Instructions (Signed)
Take doxycycline as prescribed and complete the full course.  Take Norco as needed as prescribed for pain not controlled with Motrin and Tylenol. Return to the ER for fevers, worsening pain, uncontrolled vomiting. Follow-up with urology, call tomorrow to schedule an appointment.

## 2019-05-18 LAB — URINE CULTURE: Culture: >=100000 — AB

## 2019-05-18 LAB — GC/CHLAMYDIA PROBE AMP (~~LOC~~) NOT AT ARMC
Chlamydia: NEGATIVE
Neisseria Gonorrhea: NEGATIVE

## 2019-05-19 ENCOUNTER — Telehealth: Payer: Self-pay | Admitting: Emergency Medicine

## 2019-05-19 NOTE — Telephone Encounter (Signed)
Post ED Visit - Positive Culture Follow-up: Successful Patient Follow-Up  Culture assessed and recommendations reviewed by:  []  Elenor Quinones, Pharm.D. []  Heide Guile, Pharm.D., BCPS AQ-ID []  Parks Neptune, Pharm.D., BCPS []  Alycia Rossetti, Pharm.D., BCPS []  [place], Pharm.D., BCPS, AAHIVP []  Legrand Como, Pharm.D., BCPS, AAHIVP []  Salome Arnt, PharmD, BCPS []  Johnnette Gourd, PharmD, BCPS []  Hughes Better, PharmD, BCPS []  Leeroy Cha, PharmD Gerre Pebbles PharmD  Positive urine culture  []  Patient discharged without antimicrobial prescription and treatment is now indicated [x]  Organism is resistant to prescribed ED discharge antimicrobial []  Patient with positive blood cultures  Changes discussed with ED provider: Okey Regal PA New antibiotic prescription stop doxcycline start levofloxacin 500mg  po once daily x 10 days, f/u with urology or PCP prior to completion of levofloxacin  Attempting to contact patient   Hazle Nordmann 05/19/2019, 3:44 PM

## 2019-05-19 NOTE — ED Provider Notes (Signed)
Patient's urine culture returned showing E. coli greater than 100,000.  Patient's GC negative he was discharged on ceftriaxone and doxycycline for presumed epididymitis given his testicular tenderness.  He did have findings questionable for prostatitis.  After review note and labs and discussion with pharmacy reasonable antibiotic treatment would include Levaquin for presumed epididymitis, pharmacy will reach out to the patient and make sure that he is following up within 10 days with his primary care for reevaluation and ongoing management in the setting of CT showing questionable prostatitis.    Okey Regal, PA-C 05/19/19 0919    Tegeler, Gwenyth Allegra, MD 05/19/19 1114

## 2019-05-19 NOTE — Progress Notes (Signed)
ED Antimicrobial Stewardship Positive Culture Follow Up   Dean Banks is an 38 y.o. male who presented to [facility] on 05/16/2019 with a chief complaint of  Chief Complaint  Patient presents with  . Testicle Pain  . Hematuria    Recent Results (from the past 720 hour(s))  Urine culture     Status: Abnormal   Collection Time: 05/16/19  1:17 PM   Specimen: Urine, Random  Result Value Ref Range Status   Specimen Description URINE, RANDOM  Final   Special Requests   Final    NONE Performed at [facility], [address] [place], [place] [address]    Culture >=100,000 COLONIES/mL ESCHERICHIA COLI (A)  Final   Report Status 05/18/2019 FINAL  Final   Organism ID, Bacteria ESCHERICHIA COLI (A)  Final      Susceptibility   Escherichia coli - MIC*    AMPICILLIN <=2 SENSITIVE Sensitive     CEFAZOLIN <=4 SENSITIVE Sensitive     CEFTRIAXONE <=1 SENSITIVE Sensitive     CIPROFLOXACIN <=0.25 SENSITIVE Sensitive     GENTAMICIN <=1 SENSITIVE Sensitive     IMIPENEM <=0.25 SENSITIVE Sensitive     NITROFURANTOIN <=16 SENSITIVE Sensitive     TRIMETH/SULFA <=20 SENSITIVE Sensitive     AMPICILLIN/SULBACTAM <=2 SENSITIVE Sensitive     PIP/TAZO <=4 SENSITIVE Sensitive     Extended ESBL NEGATIVE Sensitive     * >=100,000 COLONIES/mL ESCHERICHIA COLI    [x]  Treated with ceftriaxone x1 and doxycycline after discharge. Organism sensitive to ciprofloxacin. Gonorrhea and chlamydia tests were negative.   New antibiotic prescription: Will change regimen to levofloxacin 500mg  PO once daily for 10 days for treatment of presumed epididymitis. Follow-up with urology or PCP.  ED Provider: Lenn Sink PA-C   Kennon Holter, PharmD PGY1 Ambulatory Care Pharmacy Resident [employer] Phone: [phone] 05/19/2019, 9:26 AM

## 2019-05-20 ENCOUNTER — Ambulatory Visit
Admission: EM | Admit: 2019-05-20 | Discharge: 2019-05-20 | Disposition: A | Discharge status: Home / Self Care | Payer: Self-pay

## 2019-05-20 ENCOUNTER — Emergency Department (HOSPITAL_COMMUNITY)
Admission: EM | Admit: 2019-05-20 | Discharge: 2019-05-21 | Disposition: A | Discharge status: Home / Self Care | Payer: Self-pay | Attending: Emergency Medicine | Admitting: Emergency Medicine

## 2019-05-20 DIAGNOSIS — N5089 Other specified disorders of the male genital organs: Secondary | ICD-10-CM

## 2019-05-20 DIAGNOSIS — N451 Epididymitis: Secondary | ICD-10-CM | POA: Insufficient documentation

## 2019-05-20 DIAGNOSIS — N410 Acute prostatitis: Secondary | ICD-10-CM

## 2019-05-20 DIAGNOSIS — N433 Hydrocele, unspecified: Secondary | ICD-10-CM | POA: Insufficient documentation

## 2019-05-20 NOTE — ED Provider Notes (Signed)
38 year old male comes in for few day history of right testicular swelling.  He is currently being treated for prostatitis.  He was at the emergency department 05/16/2019, was given Rocephin injection and started on doxycycline.  States now with RN/white penile discharge and has had testicular swelling and pain.  Chaperone present.  Patient with significant swelling to the right testicle that is erythematous, tender to light touch.  Mild warmth to the testicle.  Given patient already on doxycycline for prostatitis, now with worsening symptoms, traveling to the testicle with significant pain.  Patient discharged in stable condition to the emergency department for further evaluation.   Ok Edwards, PA-C 05/20/19 1451

## 2019-05-20 NOTE — ED Triage Notes (Signed)
Pt states dx with infection around prostate. States was tx with doxy and norco. States has an appt with urology on 11/19. Pt c/o severe pain and now having a orange/while discharge and his testicle is enlarged.

## 2019-05-21 ENCOUNTER — Encounter (HOSPITAL_COMMUNITY): Payer: Self-pay | Admitting: Emergency Medicine

## 2019-05-21 ENCOUNTER — Emergency Department (HOSPITAL_COMMUNITY): Payer: Self-pay

## 2019-05-21 ENCOUNTER — Other Ambulatory Visit: Payer: Self-pay

## 2019-05-21 LAB — CBC WITH DIFFERENTIAL/PLATELET
Abs Immature Granulocytes: 0.17 10*3/uL — ABNORMAL HIGH (ref 0.00–0.07)
Basophils Absolute: 0.1 10*3/uL (ref 0.0–0.1)
Basophils Relative: 1 %
Eosinophils Absolute: 0.2 10*3/uL (ref 0.0–0.5)
Eosinophils Relative: 1 %
HCT: 44.7 % (ref 39.0–52.0)
Hemoglobin: 14.9 g/dL (ref 13.0–17.0)
Immature Granulocytes: 1 %
Lymphocytes Relative: 31 %
Lymphs Abs: 3.7 10*3/uL (ref 0.7–4.0)
MCH: 27.3 pg (ref 26.0–34.0)
MCHC: 33.3 g/dL (ref 30.0–36.0)
MCV: 81.9 fL (ref 80.0–100.0)
Monocytes Absolute: 1.3 10*3/uL — ABNORMAL HIGH (ref 0.1–1.0)
Monocytes Relative: 11 %
Neutro Abs: 6.7 10*3/uL (ref 1.7–7.7)
Neutrophils Relative %: 55 %
Platelets: 525 10*3/uL — ABNORMAL HIGH (ref 150–400)
RBC: 5.46 MIL/uL (ref 4.22–5.81)
RDW: 13.7 % (ref 11.5–15.5)
WBC: 12.1 10*3/uL — ABNORMAL HIGH (ref 4.0–10.5)
nRBC: 0 % (ref 0.0–0.2)

## 2019-05-21 LAB — COMPREHENSIVE METABOLIC PANEL
ALT: 67 U/L — ABNORMAL HIGH (ref 0–44)
AST: 57 U/L — ABNORMAL HIGH (ref 15–41)
Albumin: 3.7 g/dL (ref 3.5–5.0)
Alkaline Phosphatase: 135 U/L — ABNORMAL HIGH (ref 38–126)
Anion gap: 12 (ref 5–15)
BUN: 15 mg/dL (ref 6–20)
CO2: 27 mmol/L (ref 22–32)
Calcium: 9.4 mg/dL (ref 8.9–10.3)
Chloride: 97 mmol/L — ABNORMAL LOW (ref 98–111)
Creatinine, Ser: 1.01 mg/dL (ref 0.61–1.24)
GFR calc Af Amer: >60 mL/min (ref >60)
GFR calc non Af Amer: >60 mL/min (ref >60)
Glucose, Bld: 137 mg/dL — ABNORMAL HIGH (ref 70–99)
Potassium: 3.5 mmol/L (ref 3.5–5.1)
Sodium: 136 mmol/L (ref 135–145)
Total Bilirubin: 0.7 mg/dL (ref 0.3–1.2)
Total Protein: 8.6 g/dL — ABNORMAL HIGH (ref 6.5–8.1)

## 2019-05-21 LAB — URINALYSIS, ROUTINE W REFLEX MICROSCOPIC
Bacteria, UA: NONE SEEN
Glucose, UA: NEGATIVE mg/dL
Hgb urine dipstick: NEGATIVE
Ketones, ur: NEGATIVE mg/dL
Leukocytes,Ua: NEGATIVE
Nitrite: NEGATIVE
Protein, ur: 30 mg/dL — AB
Specific Gravity, Urine: 1.041 — ABNORMAL HIGH (ref 1.005–1.030)
pH: 5 (ref 5.0–8.0)

## 2019-05-21 MED ORDER — LEVOFLOXACIN 500 MG PO TABS: 500.0000 mg | ORAL_TABLET | Freq: Every day | ORAL | 0 refills | Status: AC

## 2019-05-21 NOTE — ED Triage Notes (Signed)
Patient reports right testicular pain with swelling onset yesterday , denies dysuria or hematuria , no injury or fever .

## 2019-05-21 NOTE — ED Notes (Signed)
Ultra sound done

## 2019-05-21 NOTE — ED Provider Notes (Signed)
[facility] Provider Note   CSN: [id number] Arrival date & time: 05/20/19  2357     History   Chief Complaint Chief Complaint  Patient presents with  . Right Testicle Swelling    HPI Dean BarthelMarcus Banks is a 38 y.o. male.     HPI  Sunday had lower pain and radiating to testicles, hematuria Sunday, then Tuesday began to have right testicular pain and swelling, significant pain with moving  Laying down throbs some but when walking the pain becomes more severe   Thursday afternoon went to urgent care and told to come here  Persistent, worsening pain and swelling, 9/10 pain, worse with walking On Doxycycline  Suspect subjective low grade fevers this week  11/19 Urologist   Past Medical History:  Diagnosis Date  . Hypertension     There are no active problems to display for this patient.   History reviewed. No pertinent surgical history.      Home Medications    Prior to Admission medications   Medication Sig Start Date End Date Taking? Authorizing Provider  doxycycline (VIBRAMYCIN) 100 MG capsule Take 1 capsule (100 mg total) by mouth 2 (two) times daily for 14 days. 05/16/19 05/30/19  Jeannie FendMurphy, Laura A, PA-C  HYDROcodone-acetaminophen (NORCO/VICODIN) 5-325 MG tablet Take 2 tablets by mouth every 4 (four) hours as needed. 05/16/19   Jeannie FendMurphy, Laura A, PA-C  levofloxacin (LEVAQUIN) 500 MG tablet Take 1 tablet (500 mg total) by mouth daily for 14 days. 05/21/19 06/04/19  Alvira MondaySchlossman, Mozes Sagar, MD  tiZANidine (ZANAFLEX) 4 MG tablet Take 1 tablet (4 mg total) by mouth every 8 (eight) hours as needed for muscle spasms. 05/15/19   Belinda FisherYu, Amy V, PA-C    Family History Family History  Problem Relation Age of Onset  . Hypertension Mother   . Diabetes Mother     Social History Social History   Tobacco Use  . Smoking status: Never Smoker  . Smokeless tobacco: Never Used  Substance Use Topics  . Alcohol use: Not Currently  . Drug use: Yes     Types: Marijuana     Allergies   Patient has no known allergies.   Review of Systems Review of Systems  Constitutional: Positive for fever (subjective).  Gastrointestinal: Negative for vomiting. Abdominal pain: had suprapubic pain initially.  Genitourinary: Positive for dysuria, hematuria, scrotal swelling and testicular pain.  Skin: Negative for rash.     Physical Exam Updated Vital Signs BP (!) 160/101   Pulse 89   Temp 98.1 F (36.7 C) (Oral)   Resp 18   Ht 6' (1.829 m)   Wt 99.8 kg   SpO2 98%   BMI 29.84 kg/m   Physical Exam Constitutional:      General: He is not in acute distress.    Appearance: Normal appearance. He is not ill-appearing, toxic-appearing or diaphoretic.  HENT:     Head: Normocephalic and atraumatic.  Cardiovascular:     Rate and Rhythm: Normal rate.  Genitourinary:    Scrotum/Testes:        Right: Tenderness and swelling present.  Neurological:     Mental Status: He is alert.      ED Treatments / Results  Labs (all labs ordered are listed, but only abnormal results are displayed) Labs Reviewed  URINALYSIS, ROUTINE W REFLEX MICROSCOPIC - Abnormal; Notable for the following components:      Result Value   Color, Urine AMBER (*)    APPearance HAZY (*)  Specific Gravity, Urine 1.041 (*)    Bilirubin Urine SMALL (*)    Protein, ur 30 (*)    All other components within normal limits  CBC WITH DIFFERENTIAL/PLATELET - Abnormal; Notable for the following components:   WBC 12.1 (*)    Platelets 525 (*)    Monocytes Absolute 1.3 (*)    Abs Immature Granulocytes 0.17 (*)    All other components within normal limits  COMPREHENSIVE METABOLIC PANEL - Abnormal; Notable for the following components:   Chloride 97 (*)    Glucose, Bld 137 (*)    Total Protein 8.6 (*)    AST 57 (*)    ALT 67 (*)    Alkaline Phosphatase 135 (*)    All other components within normal limits    EKG None  Radiology US Scrotum W/doppler  Result Date:  05/21/2019 CLINICAL DATA:  Right testicular pain EXAM: SCROTAL ULTRASOUND DOPPLER ULTRASOUND OF THE TESTICLES TECHNIQUE: Complete ultrasound examination of the testicles, epididymis, and other scrotal structures was performed. Color and spectral Doppler ultrasound were also utilized to evaluate blood flow to the testicles. COMPARISON:  None. FINDINGS: Right testicle Measurements: 4 x 2.6 x 3 cm.  A scrotal pearl is noted. Left testicle Measurements: 3.9 x 2.4 x 2.6 cm. No mass or microlithiasis visualized. Right epididymis: The right epididymis is enlarged and hyperemic. Right-sided scrotal wall thickening is noted. Left epididymis:  Normal in size and appearance. Hydrocele:  There is a small complex right-sided hydrocele. Varicocele:  None visualized. Pulsed Doppler interrogation of both testes demonstrates normal low resistance arterial and venous waveforms bilaterally. IMPRESSION: 1. Sonographic findings are consistent with right-sided epididymitis. 2. Small complex right-sided hydrocele. 3. No sonographic evidence for testicular torsion. 4. Right-sided scrotal wall thickening, presumably reactive. Correlation with physical exam is recommended. Electronically Signed   By: Constance Holster M.D.   On: 05/21/2019 02:48    Procedures Procedures (including critical care time)  Medications Ordered in ED Medications - No data to display   Initial Impression / Assessment and Plan / ED Course  I have reviewed the triage vital signs and the nursing notes.  Pertinent labs & imaging results that were available during my care of the patient were reviewed by me and considered in my medical decision making (see chart for details).        38 year old male presents with concern for continued right testicular swelling and pain.  Patient had been previously seen, had CT completed with concern for possible prostatitis, and clinical concern for epididymitis, who was given Rocephin and doxycycline at time of  discharge.  His urine culture came back with E. coli, however he did not get message to initiate Levaquin rather than doxycycline, and has continued doxycycline for his epididymitis without improvement.   Scrotal ultrasound with Doppler was obtained which showed no evidence of torsion, and no evidence of abscess.  Does show findings consistent with right-sided epididymitis and complex hydrocele.  Discussed with Dr. Gloriann Loan of urology, who recommends 14 days of antibiotics and urology appointment as scheduled on 19 November.  Discussed with patient will change his antibiotic from doxycycline to Levaquin to better treat E. coli, as this is the likely etiology of his epididymitis.  Recommend continued scrotal support, NSAIDs. Patient discharged in stable condition with understanding of reasons to return.   Final Clinical Impressions(s) / ED Diagnoses   Final diagnoses:  Epididymitis, right  Hydrocele, unspecified hydrocele type    ED Discharge Orders  Ordered    levofloxacin (LEVAQUIN) 500 MG tablet  Daily     05/21/19 8185           Alvira Monday, MD 05/21/19 1106

## 2020-09-05 IMAGING — CT CT RENAL STONE PROTOCOL
2 of 4 series · 16 of 46 positions shown, 18 images · non-contrast
Comparison: None.

CLINICAL DATA: RIGHT flank pain radiating to RIGHT groin,
hematuria.

EXAM:
CT ABDOMEN AND PELVIS WITHOUT CONTRAST
TECHNIQUE: Multidetector CT imaging of the abdomen and pelvis was performed
following the standard protocol without IV contrast.

[Series 3: stone study 5.0 i30f 2 · axial · 0.92mm/px · z∈[+801,+1261]mm · 13 of 100 slices shown, 15 images]
[im 4/100  soft-tissue]
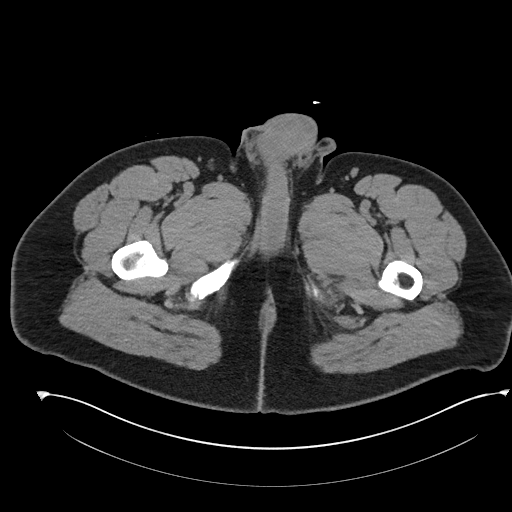
[im 4/100  bone]
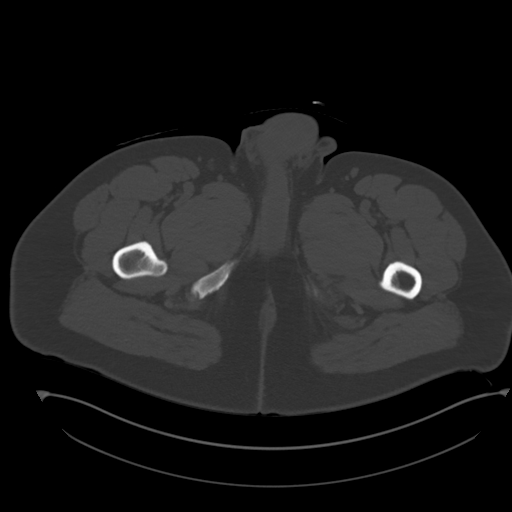
[im 12/100  soft-tissue]
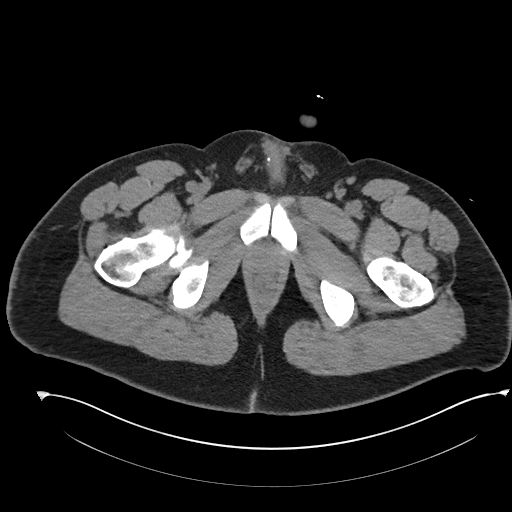
[im 20/100  soft-tissue]
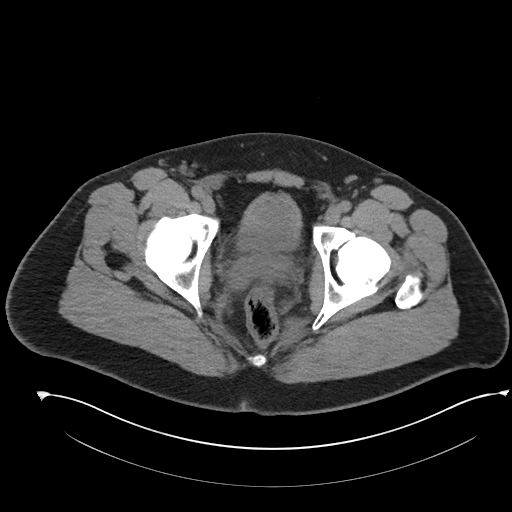
[im 28/100  soft-tissue]
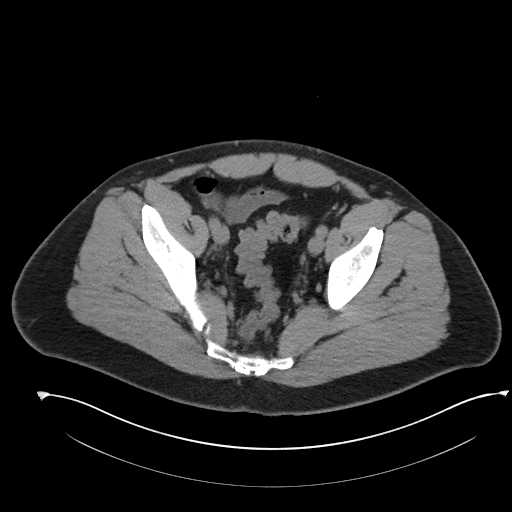
[im 36/100  soft-tissue]
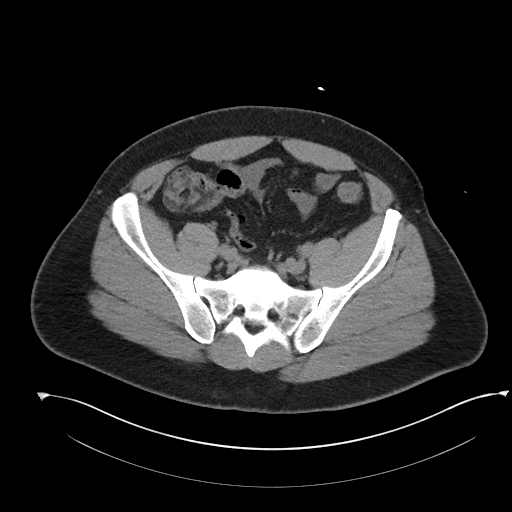
[im 44/100  soft-tissue]
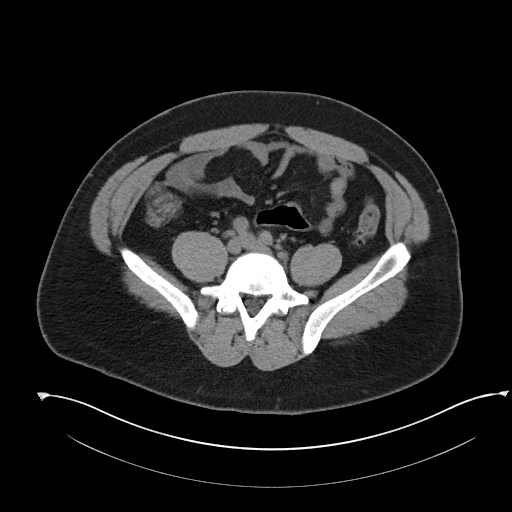
[im 52/100  soft-tissue]
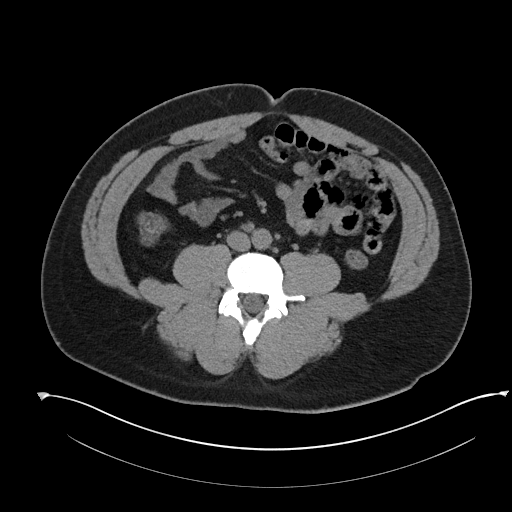
[im 56/100  soft-tissue]
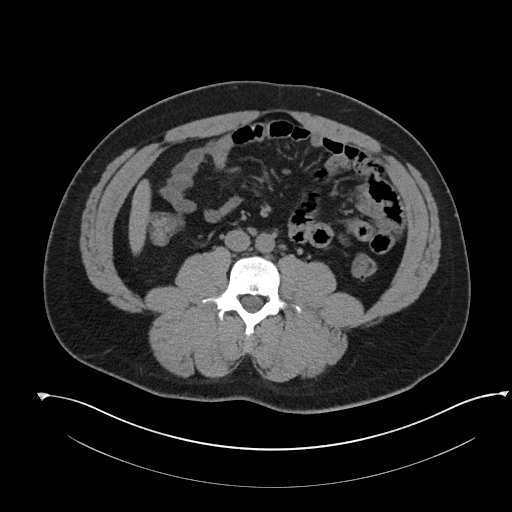
[im 64/100  soft-tissue]
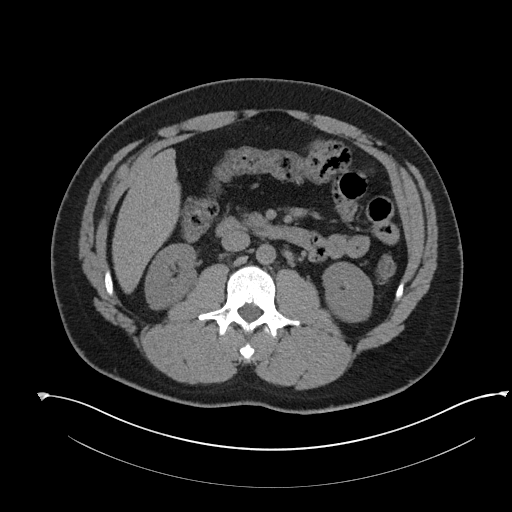
[im 64/100  bone]
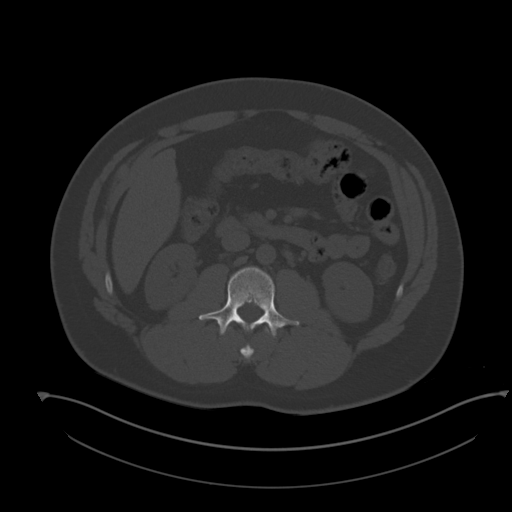
[im 72/100  soft-tissue]
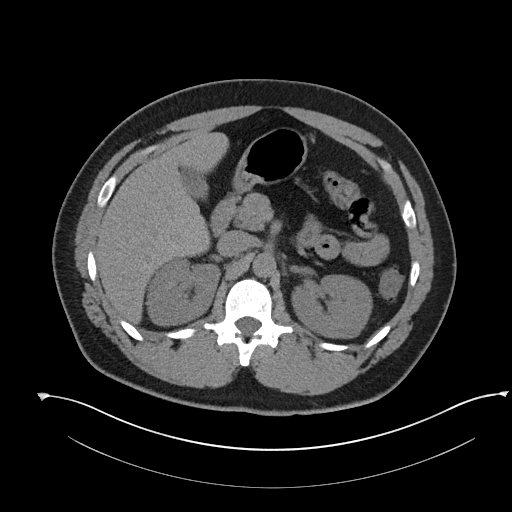
[im 80/100  soft-tissue]
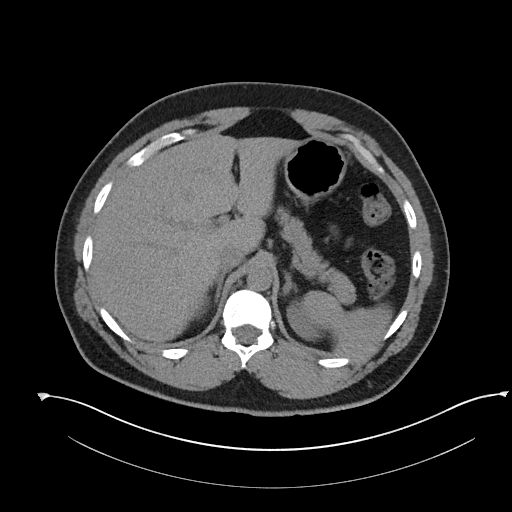
[im 88/100  soft-tissue]
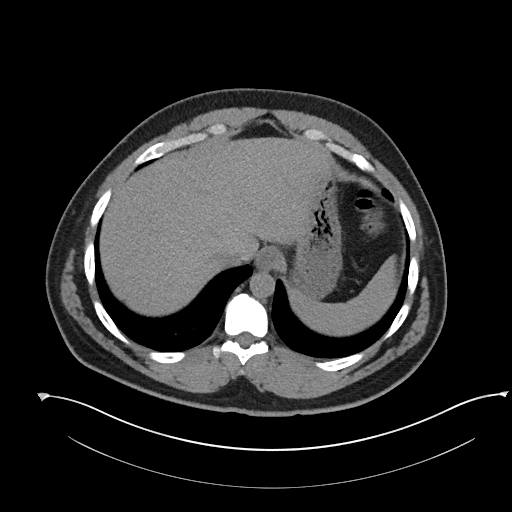
[im 96/100  soft-tissue]
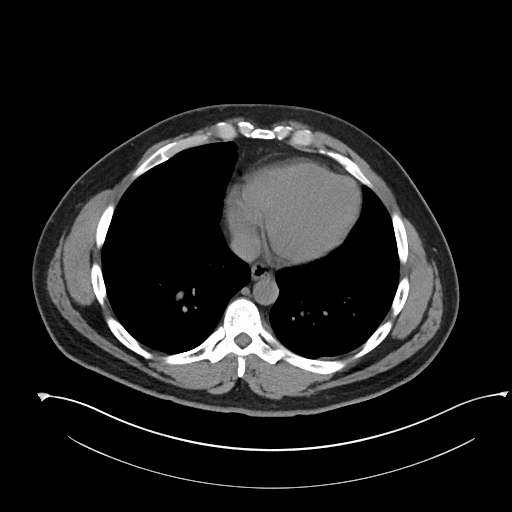

[Series 6: coronal soft tissue · coronal · 0.77mm/px · 3 of 89 slices shown]
[im 30/89  soft-tissue]
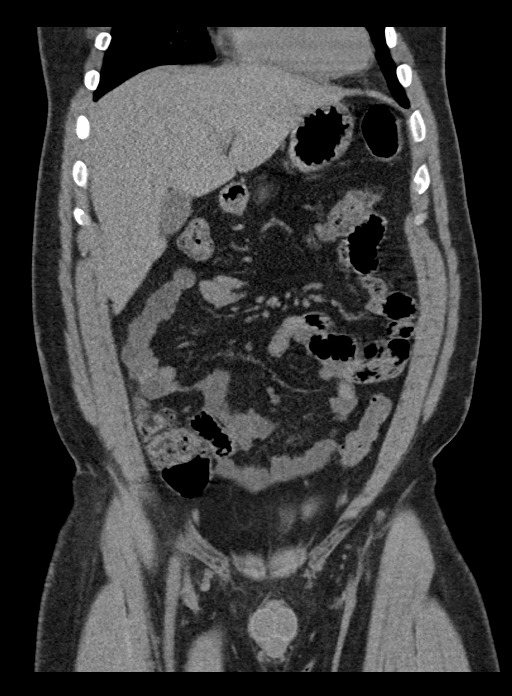
[im 40/89  soft-tissue]
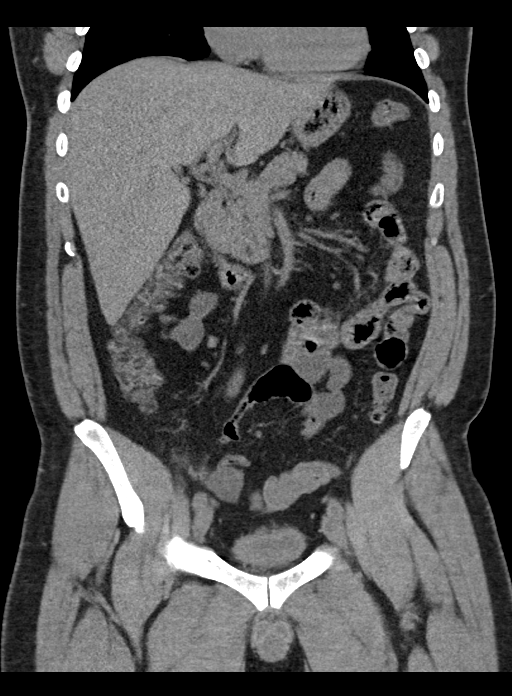
[im 49/89  soft-tissue]
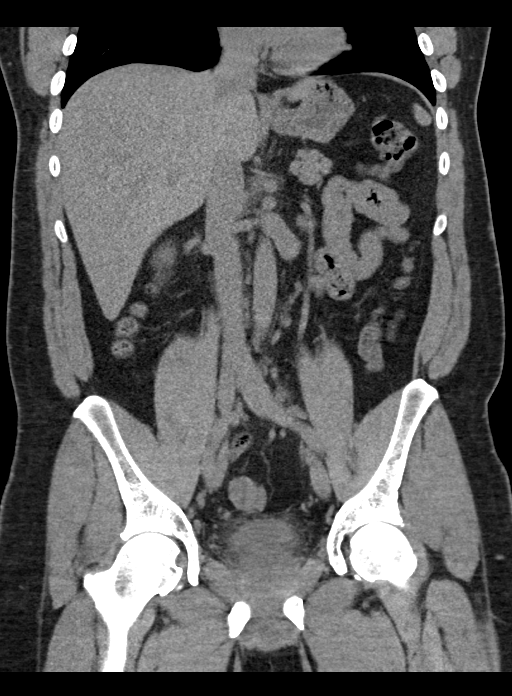

[16 of 46 positions shown; findings below may reference images not displayed]

FINDINGS: Lower chest: No acute abnormality.

Hepatobiliary: No focal liver abnormality is seen. No gallstones,
gallbladder wall thickening, or biliary dilatation.

Pancreas: Unremarkable. No pancreatic ductal dilatation or
surrounding inflammatory changes.

Spleen: Normal in size without focal abnormality.

Adrenals/Urinary Tract: Adrenal glands appear normal. Kidneys are
unremarkable without mass, stone or hydronephrosis. No perinephric
fluid. No ureteral or bladder calculi identified. Bladder is
decompressed limiting characterization.

Stomach/Bowel: No dilated large or small bowel loops. No evidence of
bowel wall inflammation seen. Appendix is normal. Stomach is
unremarkable, partially decompressed. Scattered mild diverticulosis
of thew descending and transverse colon but no focal inflammatory
change to suggest acute diverticulitis.

Vascular/Lymphatic: No significant vascular findings are present. No
enlarged abdominal or pelvic lymph nodes.

Reproductive: Prostate is unremarkable.

Other: Seminal vesicles appear prominent with surrounding edema.

Musculoskeletal: No acute or suspicious osseous finding.
IMPRESSION: 1. Seminal vesicles appear prominent with surrounding edema, raising
the possibility of prostatitis. Bladder is decompressed limiting
characterization, but these adjacent inflammation could indicate
concomitant cystitis.
2. Remainder of the abdomen and pelvis CT is unremarkable, as
detailed above. No renal or ureteral calculi identified. No bowel
obstruction or evidence of bowel wall inflammation. Appendix is
normal.
3. Mild colonic diverticulosis without evidence of acute
diverticulitis.

## 2020-09-10 IMAGING — US US SCROTUM W/ DOPPLER COMPLETE
1 series · 13 of 25 positions shown · non-contrast
Comparison: None.

CLINICAL DATA: Right testicular pain

EXAM:
SCROTAL ULTRASOUND
DOPPLER ULTRASOUND OF THE TESTICLES
TECHNIQUE: Complete ultrasound examination of the testicles, epididymis, and
other scrotal structures was performed. Color and spectral Doppler
ultrasound were also utilized to evaluate blood flow to the
testicles.

[Series 1: us scrotum w/ doppler complete · 13 of 42 slices shown]
[im 1/42]
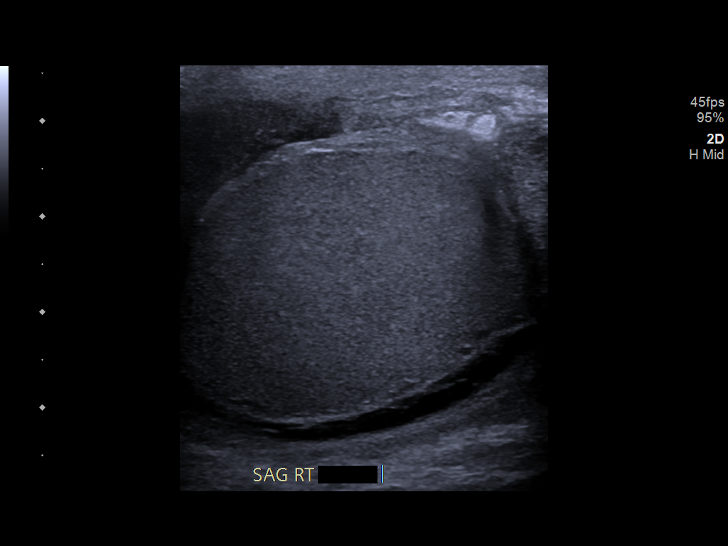
[im 4/42]
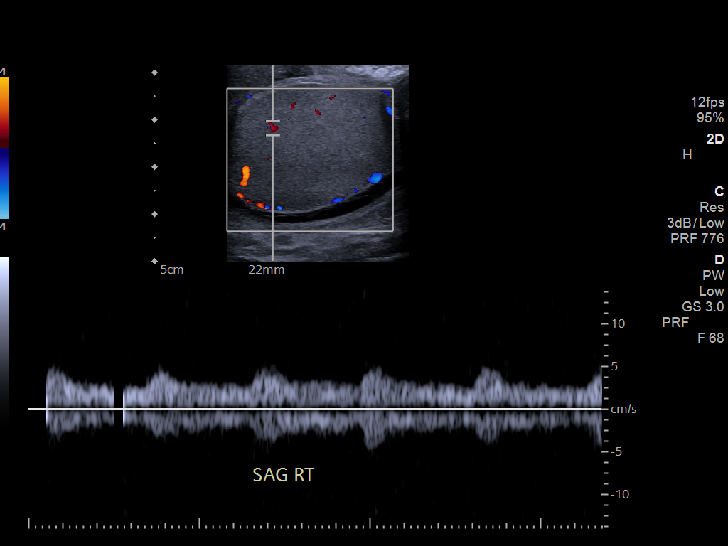
[im 7/42]
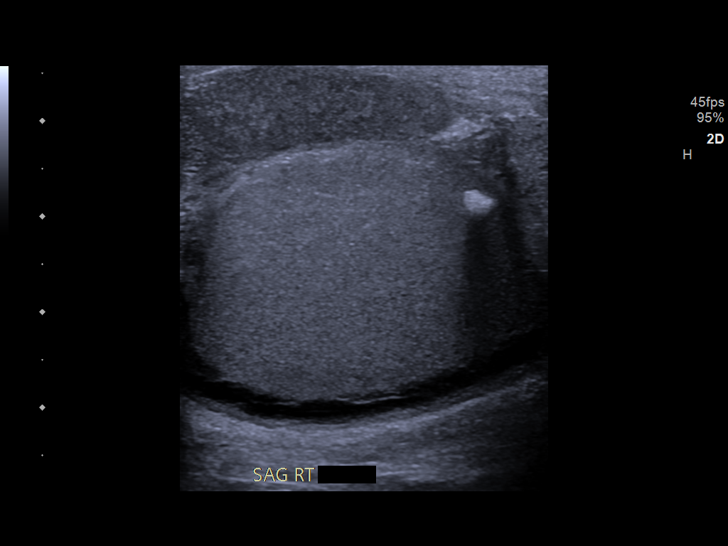
[im 11/42]
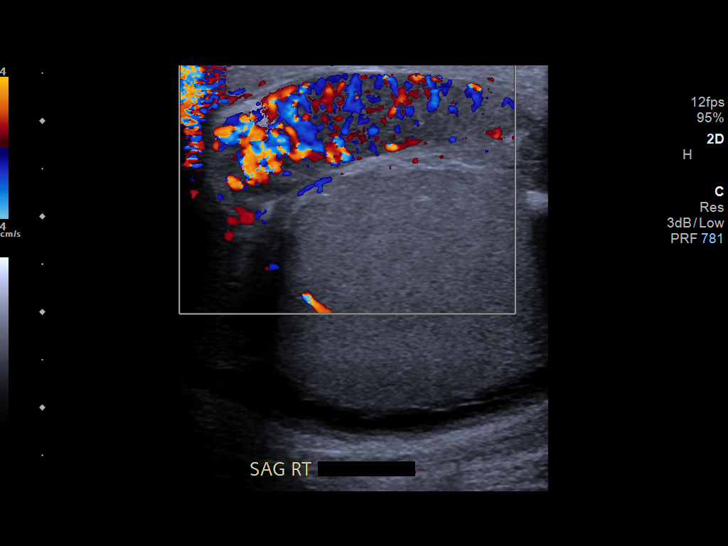
[im 14/42]
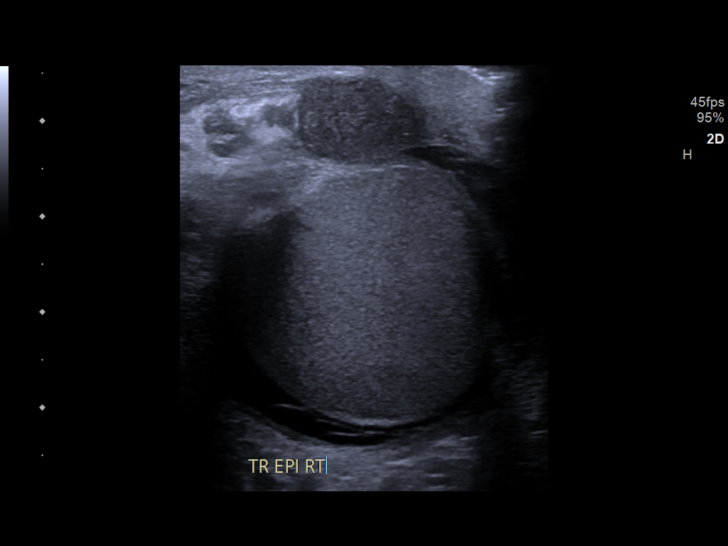
[im 18/42]
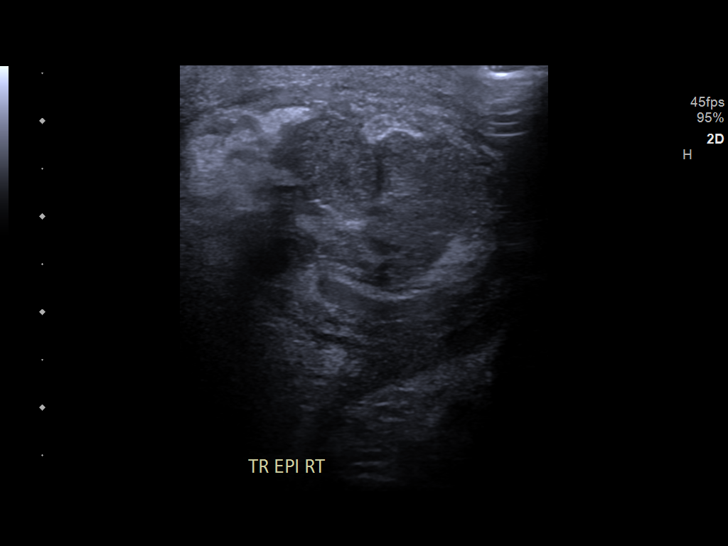
[im 21/42]
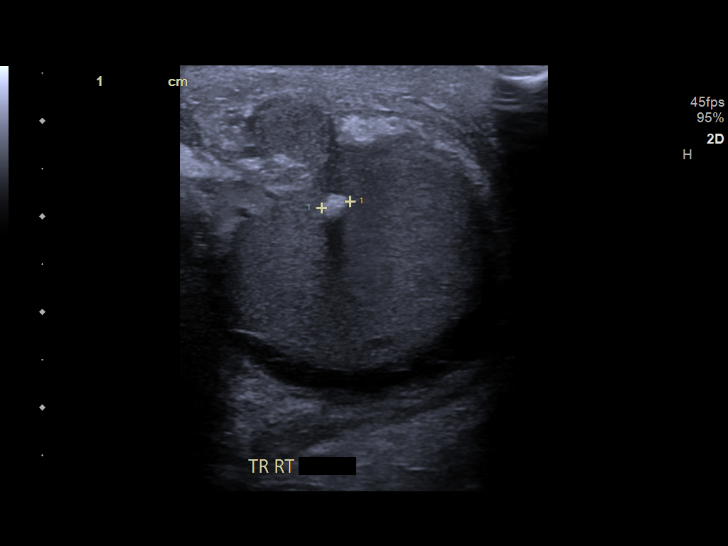
[im 24/42]
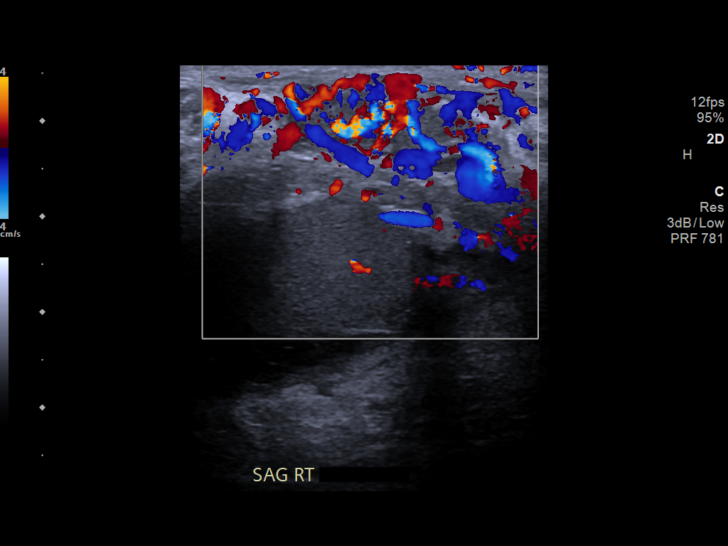
[im 28/42]
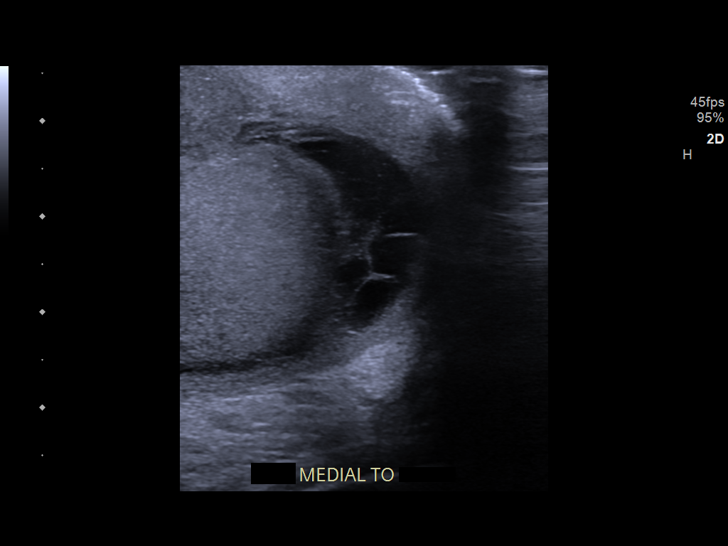
[im 31/42]
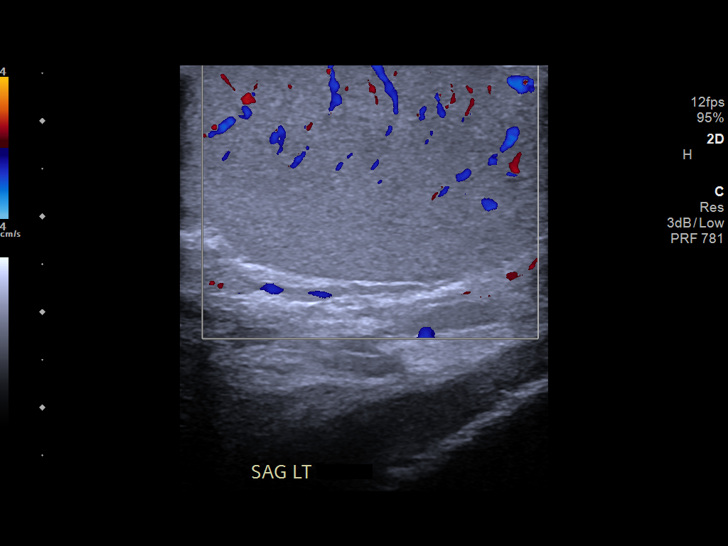
[im 35/42]
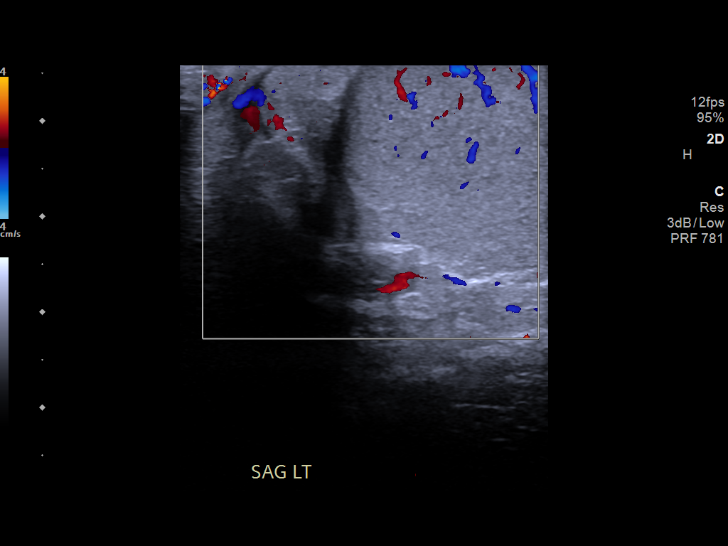
[im 38/42]
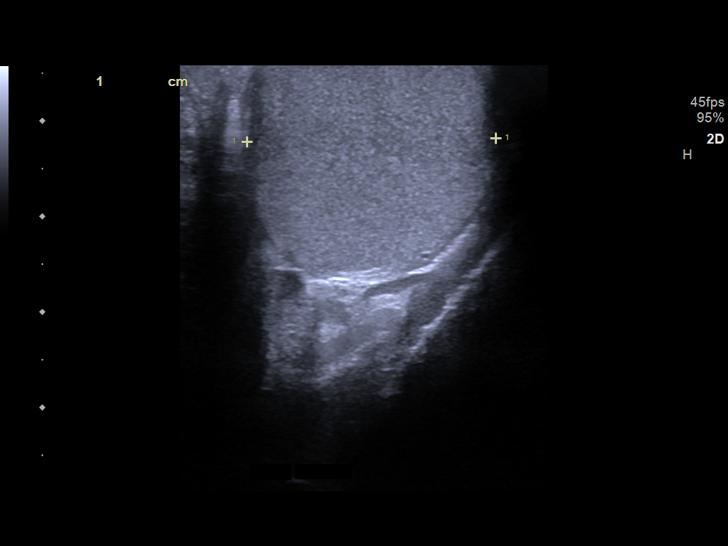
[im 42/42]
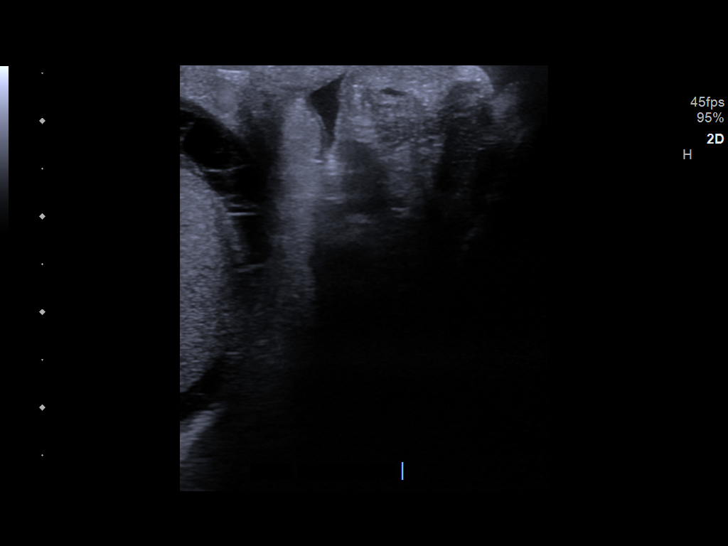

[13 of 25 positions shown; findings below may reference images not displayed]

FINDINGS: Right testicle

Measurements: 4 x 2.6 x 3 cm.  A scrotal pearl is noted.

Left testicle

Measurements: 3.9 x 2.4 x 2.6 cm. No mass or microlithiasis
visualized.

Right epididymis: The right epididymis is enlarged and hyperemic.
Right-sided scrotal wall thickening is noted.

Left epididymis:  Normal in size and appearance.

Hydrocele:  There is a small complex right-sided hydrocele.

Varicocele:  None visualized.

Pulsed Doppler interrogation of both testes demonstrates normal low
resistance arterial and venous waveforms bilaterally.
IMPRESSION: 1. Sonographic findings are consistent with right-sided
epididymitis.
2. Small complex right-sided hydrocele.
3. No sonographic evidence for testicular torsion.
4. Right-sided scrotal wall thickening, presumably reactive.
Correlation with physical exam is recommended.

## 2020-12-13 ENCOUNTER — Ambulatory Visit
Admission: EM | Admit: 2020-12-13 | Discharge: 2020-12-13 | Disposition: A | Discharge status: Home / Self Care | Payer: BLUE CROSS/BLUE SHIELD | Attending: Emergency Medicine | Admitting: Emergency Medicine

## 2020-12-13 ENCOUNTER — Ambulatory Visit (INDEPENDENT_AMBULATORY_CARE_PROVIDER_SITE_OTHER): Payer: BLUE CROSS/BLUE SHIELD

## 2020-12-13 ENCOUNTER — Other Ambulatory Visit: Payer: Self-pay

## 2020-12-13 ENCOUNTER — Encounter: Payer: Self-pay | Admitting: Emergency Medicine

## 2020-12-13 DIAGNOSIS — R059 Cough, unspecified: Secondary | ICD-10-CM

## 2020-12-13 DIAGNOSIS — J069 Acute upper respiratory infection, unspecified: Secondary | ICD-10-CM | POA: Diagnosis not present

## 2020-12-13 DIAGNOSIS — R0789 Other chest pain: Secondary | ICD-10-CM | POA: Diagnosis not present

## 2020-12-13 MED ORDER — ALBUTEROL SULFATE HFA 108 (90 BASE) MCG/ACT IN AERS: 1.0000 | INHALATION_SPRAY | Freq: Four times a day (QID) | RESPIRATORY_TRACT | 0 refills | Status: DC | PRN

## 2020-12-13 MED ORDER — BENZONATATE 200 MG PO CAPS: 200.0000 mg | ORAL_CAPSULE | Freq: Three times a day (TID) | ORAL | 0 refills | Status: AC | PRN

## 2020-12-13 MED ORDER — DM-GUAIFENESIN ER 30-600 MG PO TB12: 1.0000 | ORAL_TABLET | Freq: Two times a day (BID) | ORAL | 0 refills | Status: DC

## 2020-12-13 NOTE — ED Provider Notes (Signed)
[facility]    CSN: [id number] Arrival date & time: 12/13/20  0831      History   Chief Complaint Chief Complaint  Patient presents with  . Cough  . Hypertension    HPI Dean Banks is a 40 y.o. male history of hypertension presenting today for evaluation of chest tightness.  Reports over the past 2 days he has had cough and congestion as well as developed chest tightness specially on the left side.  Notes that symptoms are at rest.  Notes that his blood pressures been elevated for the past days as well.  Reports history of hypertension, but denies being on medicine previously.  Does not have PCP.  He denies any associated tobacco use, diabetes.  He has had some mild congestion and postnasal drainage.  Denies fevers or COVID exposure.  HPI  Past Medical History:  Diagnosis Date  . Hypertension     There are no problems to display for this patient.   History reviewed. No pertinent surgical history.     Home Medications    Prior to Admission medications   Medication Sig Start Date End Date Taking? Authorizing Provider  albuterol (VENTOLIN HFA) 108 (90 Base) MCG/ACT inhaler Inhale 1-2 puffs into the lungs every 6 (six) hours as needed for wheezing or shortness of breath. 12/13/20  Yes Briea Mcenery C, PA-C  benzonatate (TESSALON) 200 MG capsule Take 1 capsule (200 mg total) by mouth 3 (three) times daily as needed for up to 7 days for cough. 12/13/20 12/20/20 Yes Lynnsey Barbara C, PA-C  dextromethorphan-guaiFENesin (MUCINEX DM) 30-600 MG 12hr tablet Take 1 tablet by mouth 2 (two) times daily. 12/13/20  Yes Olusegun Gerstenberger C, PA-C  tiZANidine (ZANAFLEX) 4 MG tablet Take 1 tablet (4 mg total) by mouth every 8 (eight) hours as needed for muscle spasms. Patient not taking: Reported on 12/13/2020 05/15/19   Lurline Idol    Family History Family History  Problem Relation Age of Onset  . Hypertension Mother   . Diabetes Mother     Social History Social History    Tobacco Use  . Smoking status: Never Smoker  . Smokeless tobacco: Never Used  Substance Use Topics  . Alcohol use: Not Currently  . Drug use: Yes    Types: Marijuana     Allergies   Patient has no known allergies.   Review of Systems Review of Systems  Constitutional: Negative for activity change, appetite change, chills, fatigue and fever.  HENT: Positive for congestion. Negative for ear pain, rhinorrhea, sinus pressure, sore throat and trouble swallowing.   Eyes: Negative for discharge and redness.  Respiratory: Positive for cough and chest tightness. Negative for shortness of breath.   Cardiovascular: Negative for chest pain.  Gastrointestinal: Negative for abdominal pain, diarrhea, nausea and vomiting.  Musculoskeletal: Negative for myalgias.  Skin: Negative for rash.  Neurological: Negative for dizziness, light-headedness and headaches.     Physical Exam Triage Vital Signs ED Triage Vitals [12/13/20 0912]  Enc Vitals Group     BP (!) 161/116     Pulse Rate 97     Resp 18     Temp 98.3 F (36.8 C)     Temp Source Oral     SpO2 95 %     Weight      Height      Head Circumference      Peak Flow      Pain Score 0     Pain Loc  Pain Edu?      Excl. in [place]?    No data found.  Updated Vital Signs BP (!) 161/116 (BP Location: Left Arm)   Pulse 97   Temp 98.3 F (36.8 C) (Oral)   Resp 18   SpO2 95%   Visual Acuity Right Eye Distance:   Left Eye Distance:   Bilateral Distance:    Right Eye Near:   Left Eye Near:    Bilateral Near:     Physical Exam Vitals and nursing note reviewed.  Constitutional:      Appearance: He is well-developed.     Comments: No acute distress  HENT:     Head: Normocephalic and atraumatic.     Ears:     Comments: Bilateral ears without tenderness to palpation of external auricle, tragus and mastoid, EAC's without erythema or swelling, TM's with good bony landmarks and cone of light. Non erythematous.     Nose:  Nose normal.     Mouth/Throat:     Comments: Oral mucosa pink and moist, no tonsillar enlargement or exudate. Posterior pharynx patent and nonerythematous, no uvula deviation or swelling. Normal phonation. Eyes:     Conjunctiva/sclera: Conjunctivae normal.  Cardiovascular:     Rate and Rhythm: Normal rate.  Pulmonary:     Effort: Pulmonary effort is normal. No respiratory distress.     Comments: Breathing comfortably at rest, CTABL, no wheezing, rales or other adventitious sounds auscultated Abdominal:     General: There is no distension.  Musculoskeletal:        General: Normal range of motion.     Cervical back: Neck supple.  Skin:    General: Skin is warm and dry.  Neurological:     Mental Status: He is alert and oriented to person, place, and time.      UC Treatments / Results  Labs (all labs ordered are listed, but only abnormal results are displayed) Labs Reviewed  NOVEL CORONAVIRUS, NAA    EKG   Radiology DG Chest 2 View  Result Date: 12/13/2020 CLINICAL DATA:  Left chest tightness, cough and congestion EXAM: CHEST - 2 VIEW COMPARISON:  None. FINDINGS: The heart size and mediastinal contours are within normal limits. Both lungs are clear. The visualized skeletal structures are unremarkable. IMPRESSION: No active cardiopulmonary disease. Electronically Signed   By: Judie Petit.  Shick M.D.   On: 12/13/2020 10:12    Procedures Procedures (including critical care time)  Medications Ordered in UC Medications - No data to display  Initial Impression / Assessment and Plan / UC Course  I have reviewed the triage vital signs and the nursing notes.  Pertinent labs & imaging results that were available during my care of the patient were reviewed by me and considered in my medical decision making (see chart for details).  Clinical Course as of 12/13/20 1045  Wed Dec 13, 2020  0945 158/106 [HW]    Clinical Course User Index [HW] Haylo Fake C, PA-C    EKG normal sinus  rhythm, no acute signs of ischemia or infarction, chest x-ray unremarkable, treating for viral URI with cough with associated chest inflammation, Tessalon and Mucinex for congestion and cough, albuterol inhaler to use as needed for chest tightness, no obvious wheezing noted, deferring steroids at this time, COVID test pending.  Discussed elevated blood pressure and recommended establishing care and following up with primary care for recheck. Discussed strict return precautions. Patient verbalized understanding and is agreeable with plan.   Final Clinical Impressions(s) /  UC Diagnoses   Final diagnoses:  Cough  Viral URI with cough  Chest tightness     Discharge Instructions     EKG normal, chest x-ray normal COVID test pending Tessalon every 8 hours for cough Mucinex DM twice daily for further relief of cough and congestion Albuterol inhaler 1 to 2 puffs every 4-6 hours as needed for chest tightness shortness of breath Please continue to monitor your blood pressure, follow-up with primary care for follow-up and management of underlying hypertension  Please return if any symptoms not improving or worsening    ED Prescriptions    Medication Sig Dispense Auth. Provider   albuterol (VENTOLIN HFA) 108 (90 Base) MCG/ACT inhaler Inhale 1-2 puffs into the lungs every 6 (six) hours as needed for wheezing or shortness of breath. 1 each Simpson Paulos C, PA-C   benzonatate (TESSALON) 200 MG capsule Take 1 capsule (200 mg total) by mouth 3 (three) times daily as needed for up to 7 days for cough. 28 capsule Hillel Card C, PA-C   dextromethorphan-guaiFENesin (MUCINEX DM) 30-600 MG 12hr tablet Take 1 tablet by mouth 2 (two) times daily. 14 tablet Joud Pettinato, Lyle C, PA-C     PDMP not reviewed this encounter.   Azizah Lisle, [place] C, PA-C 12/13/20 1045

## 2020-12-13 NOTE — Discharge Instructions (Signed)
EKG normal, chest x-ray normal COVID test pending Tessalon every 8 hours for cough Mucinex DM twice daily for further relief of cough and congestion Albuterol inhaler 1 to 2 puffs every 4-6 hours as needed for chest tightness shortness of breath Please continue to monitor your blood pressure, follow-up with primary care for follow-up and management of underlying hypertension  Please return if any symptoms not improving or worsening

## 2020-12-13 NOTE — ED Triage Notes (Signed)
Pt here for cough and chest congestion x 2 days; pt sts some tightness; pt sts noted BP was high yesterday with hx of same but denies having PCP or meds

## 2020-12-14 LAB — NOVEL CORONAVIRUS, NAA: SARS-CoV-2, NAA: NOT DETECTED

## 2020-12-14 LAB — SARS-COV-2, NAA 2 DAY TAT

## 2022-04-11 ENCOUNTER — Ambulatory Visit
Admission: EM | Admit: 2022-04-11 | Discharge: 2022-04-11 | Disposition: A | Discharge status: Home / Self Care | Payer: 59 | Attending: Physician Assistant | Admitting: Physician Assistant

## 2022-04-11 DIAGNOSIS — J069 Acute upper respiratory infection, unspecified: Secondary | ICD-10-CM | POA: Diagnosis not present

## 2022-04-11 DIAGNOSIS — B9789 Other viral agents as the cause of diseases classified elsewhere: Secondary | ICD-10-CM | POA: Diagnosis not present

## 2022-04-11 DIAGNOSIS — R051 Acute cough: Secondary | ICD-10-CM | POA: Diagnosis not present

## 2022-04-11 DIAGNOSIS — J22 Unspecified acute lower respiratory infection: Secondary | ICD-10-CM

## 2022-04-11 MED ORDER — FLUTICASONE PROPIONATE 50 MCG/ACT NA SUSP: 2.0000 | Freq: Every day | NASAL | 0 refills | Status: DC

## 2022-04-11 MED ORDER — DM-GUAIFENESIN ER 30-600 MG PO TB12: 1.0000 | ORAL_TABLET | Freq: Two times a day (BID) | ORAL | 0 refills | Status: DC

## 2022-04-11 NOTE — ED Triage Notes (Signed)
Pt c/o productive cough with white sputum, headache, chest tightness, and back pain since last night. Took dayquil with little relief.

## 2022-04-11 NOTE — Discharge Instructions (Addendum)
Advised take Mucinex DM every 12 hours to help control cough and chest congestion. Advise use of Flonase nasal spray, 2 sprays each nostril once a day in order to help improve the sinus and nasal congestion. Advised to follow-up with PCP or return to urgent care if symptoms fail to improve.

## 2022-04-11 NOTE — ED Provider Notes (Signed)
[facility]    CSN: [id number] Arrival date & time: 04/11/22  1322      History   Chief Complaint Chief Complaint  Patient presents with   Cough    HPI Dean Banks is a 41 y.o. male.   41 year old male presents with cough and congestion.  Patient indicates that he woke up this morning having chest congestion and cough.  He relates that he has also had some upper respiratory and sinus congestion, with some rhinitis and postnasal drip which is clear.  He relates that his chest congestion causes him to cough on a repeated basis and the production so far has been clear.  He denies any fever, chills, sweats, body aches or pains.  He relates he did do a COVID test this morning when his symptoms started and it was negative.  He relates he has not been around any family or friends, or coworkers that have been sick.  Has not taken any medicines yet to try and control his symptoms.   Cough   Past Medical History:  Diagnosis Date   Hypertension     There are no problems to display for this patient.   History reviewed. No pertinent surgical history.     Home Medications    Prior to Admission medications   Medication Sig Start Date End Date Taking? Authorizing Provider  dextromethorphan-guaiFENesin (MUCINEX DM) 30-600 MG 12hr tablet Take 1 tablet by mouth 2 (two) times daily. 04/11/22  Yes Nyoka Lint, PA-C  fluticasone [facility]) 50 MCG/ACT nasal spray Place 2 sprays into both nostrils daily. 04/11/22  Yes Nyoka Lint, PA-C    Family History Family History  Problem Relation Age of Onset   Hypertension Mother    Diabetes Mother     Social History Social History   Tobacco Use   Smoking status: Never   Smokeless tobacco: Never  Substance Use Topics   Alcohol use: Not Currently   Drug use: Yes    Types: Marijuana     Allergies   Patient has no known allergies.   Review of Systems Review of Systems  Respiratory:  Positive for cough.       Physical Exam Triage Vital Signs ED Triage Vitals [04/11/22 1354]  Enc Vitals Group     BP (!) 147/96     Pulse Rate 98     Resp 18     Temp 98 F (36.7 C)     Temp Source Oral     SpO2 97 %     Weight      Height      Head Circumference      Peak Flow      Pain Score 8     Pain Loc      Pain Edu?      Excl. in [place]?    No data found.  Updated Vital Signs BP (!) 147/96 (BP Location: Left Arm)   Pulse 98   Temp 98 F (36.7 C) (Oral)   Resp 18   SpO2 97%   Visual Acuity Right Eye Distance:   Left Eye Distance:   Bilateral Distance:    Right Eye Near:   Left Eye Near:    Bilateral Near:     Physical Exam Constitutional:      Appearance: Normal appearance.  HENT:     Right Ear: Tympanic membrane and ear canal normal.     Left Ear: Tympanic membrane and ear canal normal.     Mouth/Throat:  Mouth: Mucous membranes are moist.     Pharynx: Oropharynx is clear.  Cardiovascular:     Rate and Rhythm: Normal rate and regular rhythm.     Heart sounds: Normal heart sounds.  Pulmonary:     Effort: Pulmonary effort is normal.     Breath sounds: Normal breath sounds and air entry. No wheezing, rhonchi or rales.  Lymphadenopathy:     Cervical: No cervical adenopathy.  Neurological:     Mental Status: He is alert.      UC Treatments / Results  Labs (all labs ordered are listed, but only abnormal results are displayed) Labs Reviewed - No data to display  EKG   Radiology No results found.  Procedures Procedures (including critical care time)  Medications Ordered in UC Medications - No data to display  Initial Impression / Assessment and Plan / UC Course  I have reviewed the triage vital signs and the nursing notes.  Pertinent labs & imaging results that were available during my care of the patient were reviewed by me and considered in my medical decision making (see chart for details).    Plan: 1.  The upper and lower respiratory infection  will be treated with: A.  Mucinex DM every 12 hours take troll the cough and chest congestion. B.  Flonase nasal spray 2 sprays each nostril once daily to control the sinus congestion and upper respiratory congestion. C.  Advised take ibuprofen or Tylenol for pain relief if needed. D.  Advised to follow-up PCP or return to urgent care if symptoms fail to improve. Final Clinical Impressions(s) / UC Diagnoses   Final diagnoses:  Acute cough  Viral infection of lower respiratory system  Viral upper respiratory infection     Discharge Instructions      Advised take Mucinex DM every 12 hours to help control cough and chest congestion. Advise use of Flonase nasal spray, 2 sprays each nostril once a day in order to help improve the sinus and nasal congestion. Advised to follow-up with PCP or return to urgent care if symptoms fail to improve.    ED Prescriptions     Medication Sig Dispense Auth. Provider   dextromethorphan-guaiFENesin (MUCINEX DM) 30-600 MG 12hr tablet Take 1 tablet by mouth 2 (two) times daily. 20 tablet Ellsworth Lennox, PA-C   fluticasone [facility]) 50 MCG/ACT nasal spray Place 2 sprays into both nostrils daily. 11.1 g Ellsworth Lennox, PA-C      PDMP not reviewed this encounter.   Ellsworth Lennox, PA-C 04/11/22 1417

## 2022-08-05 ENCOUNTER — Ambulatory Visit
Admission: EM | Admit: 2022-08-05 | Discharge: 2022-08-05 | Disposition: A | Discharge status: Home / Self Care | Payer: Commercial Managed Care - HMO | Attending: Nurse Practitioner | Admitting: Nurse Practitioner

## 2022-08-05 ENCOUNTER — Encounter: Payer: Self-pay | Admitting: Emergency Medicine

## 2022-08-05 DIAGNOSIS — M940 Chondrocostal junction syndrome [Tietze]: Secondary | ICD-10-CM

## 2022-08-05 DIAGNOSIS — I10 Essential (primary) hypertension: Secondary | ICD-10-CM | POA: Diagnosis not present

## 2022-08-05 MED ORDER — AMLODIPINE BESYLATE 5 MG PO TABS: 5.0000 mg | ORAL_TABLET | Freq: Every day | ORAL | 0 refills | Status: DC

## 2022-08-05 MED ORDER — NAPROXEN 500 MG PO TABS: 500.0000 mg | ORAL_TABLET | Freq: Two times a day (BID) | ORAL | 0 refills | Status: DC

## 2022-08-05 MED ORDER — DEXAMETHASONE SODIUM PHOSPHATE 10 MG/ML IJ SOLN
10.0000 mg | Freq: Once | INTRAMUSCULAR | Status: AC
  Administered 2022-08-05: 10 mg via INTRAMUSCULAR

## 2022-08-05 NOTE — ED Provider Notes (Signed)
[facility]    CSN: [id number] Arrival date & time: 08/05/22  1137      History   Chief Complaint Chief Complaint  Patient presents with   Rib Pain    HPI Dean Banks is a 42 y.o. male.   Subjective:  Dean Banks is a 42 y.o. male who presents for evaluation of chest wall pain. Onset was 1 day ago. Symptoms have been unchanged since that time. The patient describes the pain as aching and sharp in the right lateral chest wall. Patient rates pain as a 10/10 in intensity. He denies any associated cough, hemoptysis,or shortness of breath. Aggravating factors are: deep inspiration. Alleviating factors are: position change and rest. Mechanism of injury: none known. Previous visits for this problem: none.Treatment to date: none.  The following portions of the patient's history were reviewed and updated as appropriate: allergies, current medications, past family history, past medical history, past social history, past surgical history, and problem list.        Past Medical History:  Diagnosis Date   Hypertension     There are no problems to display for this patient.   History reviewed. No pertinent surgical history.     Home Medications    Prior to Admission medications   Medication Sig Start Date End Date Taking? Authorizing Provider  amLODipine (NORVASC) 5 MG tablet Take 1 tablet (5 mg total) by mouth daily. 08/05/22 09/04/22 Yes Enrique Sack, FNP  naproxen (NAPROSYN) 500 MG tablet Take 1 tablet (500 mg total) by mouth 2 (two) times daily. Take with food 08/05/22  Yes Enrique Sack, FNP    Family History Family History  Problem Relation Age of Onset   Hypertension Mother    Diabetes Mother     Social History Social History   Tobacco Use   Smoking status: Never   Smokeless tobacco: Never  Vaping Use   Vaping Use: Never used  Substance Use Topics   Alcohol use: Not Currently   Drug use: Yes    Types: Marijuana     Allergies    Patient has no known allergies.   Review of Systems Review of Systems  Eyes:  Negative for visual disturbance.  Respiratory:  Negative for cough and shortness of breath.   Cardiovascular:  Positive for chest pain. Negative for palpitations and leg swelling.  Gastrointestinal:  Negative for nausea and vomiting.  Neurological:  Negative for dizziness and headaches.  All other systems reviewed and are negative.    Physical Exam Triage Vital Signs ED Triage Vitals  Enc Vitals Group     BP 08/05/22 1424 (!) 173/114     Pulse Rate 08/05/22 1424 83     Resp 08/05/22 1424 18     Temp 08/05/22 1424 (!) 97.3 F (36.3 C)     Temp Source 08/05/22 1424 Oral     SpO2 08/05/22 1424 97 %     Weight 08/05/22 1426 220 lb (99.8 kg)     Height 08/05/22 1426 6' (1.829 m)     Head Circumference --      Peak Flow --      Pain Score 08/05/22 1426 10     Pain Loc --      Pain Edu? --      Excl. in [place]? --    No data found.  Updated Vital Signs BP (!) 173/114 (BP Location: Left Arm)   Pulse 83   Temp (!) 97.3 F (36.3 C) (Oral)   Resp 18  Ht 6' (1.829 m)   Wt 220 lb (99.8 kg)   SpO2 97%   BMI 29.84 kg/m   Visual Acuity Right Eye Distance:   Left Eye Distance:   Bilateral Distance:    Right Eye Near:   Left Eye Near:    Bilateral Near:     Physical Exam Constitutional:      General: He is not in acute distress.    Appearance: Normal appearance. He is not ill-appearing, toxic-appearing or diaphoretic.  HENT:     Head: Normocephalic.     Nose: Nose normal.  Eyes:     Conjunctiva/sclera: Conjunctivae normal.  Cardiovascular:     Rate and Rhythm: Normal rate and regular rhythm.  Pulmonary:     Effort: Pulmonary effort is normal.     Breath sounds: Normal breath sounds.  Chest:     Chest wall: Tenderness present.     Comments: Right lateral chest wall tenderness noted with palpation and deep breathing. No swelling or chest wall deformity noted.  Musculoskeletal:         General: Normal range of motion.     Cervical back: Normal range of motion and neck supple.  Skin:    General: Skin is warm and dry.  Neurological:     General: No focal deficit present.     Mental Status: He is alert and oriented to person, place, and time.      UC Treatments / Results  Labs (all labs ordered are listed, but only abnormal results are displayed) Labs Reviewed - No data to display  EKG   Radiology No results found.  Procedures Procedures (including critical care time)  Medications Ordered in UC Medications  dexamethasone (DECADRON) injection 10 mg (10 mg Intramuscular Given 08/05/22 1458)    Initial Impression / Assessment and Plan / UC Course  I have reviewed the triage vital signs and the nursing notes.  Pertinent labs & imaging results that were available during my care of the patient were reviewed by me and considered in my medical decision making (see chart for details).   42 yo male with right lateral chest wall pain consistent with acute costochondritis.  Patient is afebrile and nontoxic.  Exam as above.  Decadron IM given in clinic.  Patient prescribed naproxen twice daily x 10 days.  Supportive care measures discussed.  Additionally, patient noted to be hypertensive with a blood pressure 173/114.  Patient states that prior history of hypertension but is not on any medications.  No visual disturbances, headache or dizziness.  Patient started on Norvasc 5 mg daily.  Advised to monitor pressure closely.  He has an appointment with a new PCP on February 2nd.  Today's evaluation has revealed no signs of a dangerous process. Discussed diagnosis with patient and/or guardian. Patient and/or guardian aware of their diagnosis, possible red flag symptoms to watch out for and need for close follow up. Patient and/or guardian understands verbal and written discharge instructions. Patient and/or guardian comfortable with plan and disposition.  Patient and/or guardian  has a clear mental status at this time, good insight into illness (after discussion and teaching) and has clear judgment to make decisions regarding their care  Documentation was completed with the aid of voice recognition software. Transcription may contain typographical errors. Final Clinical Impressions(s) / UC Diagnoses   Final diagnoses:  Costochondritis  Essential hypertension  Elevated blood pressure reading in office with diagnosis of hypertension     Discharge Instructions  Costochondritis is inflammation of the tissue that connects the ribs to the breastbone. This is what is causing your pain. The pain usually starts slowly and involves more than one rib.  This condition usually goes away on its own over time but treatment may be necessary. You received an injection of steroids here in the clinic which will help inflammation. I have also provided a prescription of an NSAID that you should take twice a day. Make sure to take with food to prevent any stomach upset.   You should also:  Rest and avoid activities that make pain worse. Applying heat or ice to the area to reduce pain and inflammation. You can do all ice or all heat if one feels better than the other  Do not lift anything that is heavier than 10 lb for the next couple of days or when the pain improves   Your blood pressure was elevated today. Managing hypertension is very important. Over time, hypertension can damage the arteries and decrease blood flow to parts of the body, including the brain, heart, and kidneys. Having untreated or uncontrolled hypertension can lead to  heart attack, stroke, weakened blood vessels (aneurysm), heart failure, kidney damage, eye damage, memory/concentration problems and vascular dementia. Please monitor your blood pressure closely and take your medications as prescribed. Go to the ED immediately if you develop a severe headache, dizziness, sudden vision problems, confusion, experience  unusual weakness or numbness, severe pain in your chest or abdomen, you vomit repeatedly or have trouble breathing.  Please make sure you follow-up with your primary care provider on February 2nd as scheduled      ED Prescriptions     Medication Sig Dispense Auth. Provider   amLODipine (NORVASC) 5 MG tablet Take 1 tablet (5 mg total) by mouth daily. 30 tablet Lurline Idol, FNP   naproxen (NAPROSYN) 500 MG tablet Take 1 tablet (500 mg total) by mouth 2 (two) times daily. Take with food 30 tablet Lurline Idol, FNP      PDMP not reviewed this encounter.   Lurline Idol, [place] 08/05/22 1505

## 2022-08-05 NOTE — Discharge Instructions (Addendum)
Costochondritis is inflammation of the tissue that connects the ribs to the breastbone. This is what is causing your pain. The pain usually starts slowly and involves more than one rib.  This condition usually goes away on its own over time but treatment may be necessary. You received an injection of steroids here in the clinic which will help inflammation. I have also provided a prescription of an NSAID that you should take twice a day. Make sure to take with food to prevent any stomach upset.   You should also:  Rest and avoid activities that make pain worse. Applying heat or ice to the area to reduce pain and inflammation. You can do all ice or all heat if one feels better than the other  Do not lift anything that is heavier than 10 lb for the next couple of days or when the pain improves   Your blood pressure was elevated today. Managing hypertension is very important. Over time, hypertension can damage the arteries and decrease blood flow to parts of the body, including the brain, heart, and kidneys. Having untreated or uncontrolled hypertension can lead to  heart attack, stroke, weakened blood vessels (aneurysm), heart failure, kidney damage, eye damage, memory/concentration problems and vascular dementia. Please monitor your blood pressure closely and take your medications as prescribed. Go to the ED immediately if you develop a severe headache, dizziness, sudden vision problems, confusion, experience unusual weakness or numbness, severe pain in your chest or abdomen, you vomit repeatedly or have trouble breathing.  Please make sure you follow-up with your primary care provider on February 2nd as scheduled

## 2022-08-05 NOTE — ED Triage Notes (Signed)
Patient c/o right sided sharp pains under his rib area since last night.  No apparent injury, trouble taken deep breathes.  Denies any cough.

## 2022-09-03 ENCOUNTER — Telehealth: Payer: Self-pay

## 2022-09-03 NOTE — Telephone Encounter (Signed)
Received referral for Sleep apnea from [facility]. Placed in sleep lab mailbox

## 2022-10-10 ENCOUNTER — Encounter: Payer: Self-pay | Admitting: *Deleted

## 2022-10-14 ENCOUNTER — Ambulatory Visit (INDEPENDENT_AMBULATORY_CARE_PROVIDER_SITE_OTHER): Payer: BLUE CROSS/BLUE SHIELD | Admitting: Neurology

## 2022-10-14 ENCOUNTER — Encounter: Payer: Self-pay | Admitting: Neurology

## 2022-10-14 VITALS — BP 139/90 | HR 72 | Ht 66.0 in | Wt 279.4 lb

## 2022-10-14 DIAGNOSIS — G4719 Other hypersomnia: Secondary | ICD-10-CM | POA: Diagnosis not present

## 2022-10-14 DIAGNOSIS — Z9189 Other specified personal risk factors, not elsewhere classified: Secondary | ICD-10-CM

## 2022-10-14 DIAGNOSIS — R0681 Apnea, not elsewhere classified: Secondary | ICD-10-CM

## 2022-10-14 DIAGNOSIS — R519 Headache, unspecified: Secondary | ICD-10-CM

## 2022-10-14 DIAGNOSIS — Z6841 Body Mass Index (BMI) 40.0 and over, adult: Secondary | ICD-10-CM

## 2022-10-14 DIAGNOSIS — Z820 Family history of epilepsy and other diseases of the nervous system: Secondary | ICD-10-CM

## 2022-10-14 DIAGNOSIS — R0683 Snoring: Secondary | ICD-10-CM | POA: Diagnosis not present

## 2022-10-14 DIAGNOSIS — R0689 Other abnormalities of breathing: Secondary | ICD-10-CM

## 2022-10-14 NOTE — Patient Instructions (Signed)

## 2022-10-14 NOTE — Progress Notes (Signed)
Subjective:    Patient ID: Dean Banks is a 42 y.o. male.  HPI    Star Age, MD, PhD [facility] [address] [place], [place] [address]  Dear [name],   I saw your patient, Dean Banks, upon your kind request in my sleep clinic today for initial consultation of his sleep disorder, in particular, concern for underlying obstructive sleep apnea.  The patient is unaccompanied today.  As you know, [name] is a 42 year old male with an underlying medical history of hypertension, prediabetes, reflux disease, and severe obesity with a BMI of over 45, who reports snoring and excessive daytime somnolence as well as witnessed apneas per SO's report.  His Epworth sleepiness score is 13 out of 24, fatigue severity score is 34 out of 63.  I reviewed your office note from 08/24/2022.  He lives with his significant other, [name], and his 2 children, daughter, age 35 and son, age 53.  They have 1 dog in the household and the dog does not typically sleep in his bedroom.  He has a TV in his bedroom but it is not on at night, he likes to sleep in a dark and quiet room.  He has woken up with a sense of gasping for air.  Symptoms have been ongoing for several months.  He is working on weight loss and in the past couple of months he has lost over 10 pounds.  Bedtime is generally between 11 and midnight and rise time around 6 AM.  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  He works from 7 AM to 3:30 PM.  He has a cousin with sleep apnea and his sister probably also has sleep apnea as he recalls.  He drinks no daily caffeine.  He does not drink any alcohol.  He does not smoke cigarettes, he smokes marijuana occasionally.  He denies night to night nocturia but has occasionally woken up with a headache.  He does not typically take any medication for his headache.  His Past Medical History Is Significant For: Past Medical History:  Diagnosis Date    Hypertension    Prediabetes    Sleep apnea     His Past Surgical History Is Significant For: No past surgical history on file.  His Family History Is Significant For: Family History  Problem Relation Age of Onset   Hypertension Mother    Diabetes Mother    Hashimoto's thyroiditis Sister    Hypertension Maternal Grandmother    Hypertension Maternal Grandfather     His Social History Is Significant For: Social History   Socioeconomic History   Marital status: Single    Spouse name: Not on file   Number of children: Not on file   Years of education: Not on file   Highest education level: Not on file  Occupational History   Not on file  Tobacco Use   Smoking status: Never   Smokeless tobacco: Never  Vaping Use   Vaping Use: Never used  Substance and Sexual Activity   Alcohol use: Not Currently   Drug use: Yes    Types: Marijuana   Sexual activity: Yes  Other Topics Concern   Not on file  Social History Narrative   Caffiene  2-3 cans per day, (not concurrently).  2-3 20 tea (sweet)  not concurrenty.   Daytime work schedule.    Social Determinants of Health   Financial Resource Strain: Not on file  Food Insecurity:  Not on file  Transportation Needs: Not on file  Physical Activity: Not on file  Stress: Not on file  Social Connections: Not on file    His Allergies Are:  No Known Allergies:   His Current Medications Are:  Outpatient Encounter Medications as of 10/14/2022  Medication Sig   famotidine (PEPCID) 20 MG tablet Take 20 mg by mouth 2 (two) times daily.   losartan (COZAAR) 50 MG tablet Take 50 mg by mouth daily.   metFORMIN (GLUCOPHAGE) 500 MG tablet Take 500 mg by mouth 2 (two) times daily with a meal.   [DISCONTINUED] amLODipine (NORVASC) 5 MG tablet Take 1 tablet (5 mg total) by mouth daily.   [DISCONTINUED] naproxen (NAPROSYN) 500 MG tablet Take 1 tablet (500 mg total) by mouth 2 (two) times daily. Take with food   No facility-administered encounter  medications on file as of 10/14/2022.  :   Review of Systems:  Out of a complete 14 point review of systems, all are reviewed and negative with the exception of these symptoms as listed below:  Review of Systems  Neurological:        OSA concerns, hypertension, prediabetes.  ESS 13 FSS 34.  Snores.  Has phone record - shows apnea.     Objective:  Neurological Exam  Physical Exam Physical Examination:   Vitals:   10/14/22 1113  BP: (!) 139/90  Pulse: 72    General Examination: The patient is a very pleasant 42 y.o. male in no acute distress. He appears well-developed and well-nourished and well groomed.   HEENT: Normocephalic, atraumatic, pupils are equal, round and reactive to light, extraocular tracking is good without limitation to gaze excursion or nystagmus noted. Hearing is grossly intact. Face is symmetric with normal facial animation. Speech is clear with no dysarthria noted. There is no hypophonia. There is no lip, neck/head, jaw or voice tremor. Neck is supple with full range of passive and active motion. There are no carotid bruits on auscultation. Oropharynx exam reveals: mild mouth dryness, adequate dental hygiene and moderate airway crowding, due to small airway entry, tonsils about 1+ bilaterally.  Mallampati class II.  Slightly wider uvula.  He has a mild underbite.  Tongue protrudes centrally and palate elevates symmetrically, neck circumference 19-3/8 inches.  Chest: Clear to auscultation without wheezing, rhonchi or crackles noted.  Heart: S1+S2+0, regular and normal without murmurs, rubs or gallops noted.   Abdomen: Soft, non-tender and non-distended.  Extremities: There is no pitting edema in the distal lower extremities bilaterally.   Skin: Warm and dry without trophic changes noted.   Musculoskeletal: exam reveals no obvious joint deformities.   Neurologically:  Mental status: The patient is awake, alert and oriented in all 4 spheres. His immediate and  remote memory, attention, language skills and fund of knowledge are appropriate. There is no evidence of aphasia, agnosia, apraxia or anomia. Speech is clear with normal prosody and enunciation. Thought process is linear. Mood is normal and affect is normal.  Cranial nerves II - XII are as described above under HEENT exam.  Motor exam: Normal bulk, strength and tone is noted. There is no obvious action or resting tremor.  Fine motor skills and coordination: grossly intact.  Cerebellar testing: No dysmetria or intention tremor. There is no truncal or gait ataxia.  Sensory exam: intact to light touch in the upper and lower extremities.  Gait, station and balance: He stands easily. No veering to one side is noted. No leaning to one side is  noted. Posture is age-appropriate and stance is narrow based. Gait shows normal stride length and normal pace. No problems turning are noted.   Assessment and Plan:  In summary, Kartier Ater is a very pleasant 42 y.o.-year old male with an underlying medical history of hypertension, prediabetes, reflux disease, and severe obesity with a BMI of over 45, whose history and physical exam are concerning for sleep disordered breathing, particularly obstructive sleep apnea (OSA). While a laboratory attended sleep study is typically considered "gold standard" for evaluation of sleep disordered breathing, we mutually agreed to proceed with a home sleep test at this time.   I had a long chat with the patient about my findings and the diagnosis of sleep apnea, particularly OSA, its prognosis and treatment options. We talked about medical/conservative treatments, surgical interventions and non-pharmacological approaches for symptom control. I explained, in particular, the risks and ramifications of untreated moderate to severe OSA, especially with respect to developing cardiovascular disease down the road, including congestive heart failure (CHF), difficult to treat hypertension,  cardiac arrhythmias (particularly A-fib), neurovascular complications including TIA, stroke and dementia. Even type 2 diabetes has, in part, been linked to untreated OSA. Symptoms of untreated OSA may include (but may not be limited to) daytime sleepiness, nocturia (i.e. frequent nighttime urination), memory problems, mood irritability and suboptimally controlled or worsening mood disorder such as depression and/or anxiety, lack of energy, lack of motivation, physical discomfort, as well as recurrent headaches, especially morning or nocturnal headaches. We talked about the importance of maintaining a healthy lifestyle and striving for healthy weight.  In addition, we talked about the importance of striving for and maintaining good sleep hygiene. I recommended a sleep study at this time. I outlined the differences between a laboratory attended sleep study which is considered more comprehensive and accurate over the option of a home sleep test (HST); the latter may lead to underestimation of sleep disordered breathing in some instances and does not help with diagnosing upper airway resistance syndrome and is not accurate enough to diagnose primary central sleep apnea typically. I outlined possible surgical and non-surgical treatment options of OSA, including the use of a positive airway pressure (PAP) device (i.e. CPAP, AutoPAP/APAP or BiPAP in certain circumstances), a custom-made dental device (aka oral appliance, which would require a referral to a specialist dentist or orthodontist typically, and is generally speaking not considered for patients with full dentures or edentulous state), upper airway surgical options, such as traditional UPPP (which is not considered a first-line treatment) or the Inspire device (hypoglossal nerve stimulator, which would involve a referral for consultation with an ENT surgeon, after careful selection, following inclusion criteria - also not first-line treatment). I explained the  PAP treatment option to the patient in detail, as this is generally considered first-line treatment.  The patient indicated that he would be willing to try PAP therapy, if the need arises. I explained the importance of being compliant with PAP treatment, not only for insurance purposes but primarily to improve patient's symptoms symptoms, and for the patient's long term health benefit, including to reduce His cardiovascular risks longer-term.    We will pick up our discussion about the next steps and treatment options after testing.  We will keep him posted as to the test results by phone call and/or MyChart messaging where possible.  We will plan to follow-up in sleep clinic accordingly as well.  I answered all his questions today and the patient was in agreement.   I encouraged him to  call with any interim questions, concerns, problems or updates or email [place] through [facility].  Generally speaking, sleep test authorizations may take up to 2 weeks, sometimes less, sometimes longer, the patient is encouraged to get in touch with [place] if they do not hear back from the sleep lab staff directly within the next 2 weeks.  Thank you very much for allowing me to participate in the care of this nice patient. If I can be of any further assistance to you please do not hesitate to call me at [phone].  Sincerely,   Star Age, MD, PhD

## 2022-11-05 ENCOUNTER — Telehealth: Payer: Self-pay | Admitting: Neurology

## 2022-11-05 NOTE — Telephone Encounter (Signed)
BC[employer] pending send up to review

## 2022-11-19 NOTE — Telephone Encounter (Signed)
BCBS denied because it is out of network with [place]. See mychart message from 11/12/22.
